# Patient Record
Sex: Female | Born: 1990 | Race: Black or African American | Hispanic: No | Marital: Married | Smoking: Never smoker
Health system: Southern US, Community
[De-identification: ages and names within clinical notes are randomized; demographics above are authoritative.]

## PROBLEM LIST (undated history)

## (undated) ENCOUNTER — Inpatient Hospital Stay (HOSPITAL_COMMUNITY): Payer: Self-pay

## (undated) DIAGNOSIS — G43909 Migraine, unspecified, not intractable, without status migrainosus: Secondary | ICD-10-CM

## (undated) DIAGNOSIS — I10 Essential (primary) hypertension: Secondary | ICD-10-CM

## (undated) DIAGNOSIS — O139 Gestational [pregnancy-induced] hypertension without significant proteinuria, unspecified trimester: Secondary | ICD-10-CM

## (undated) HISTORY — PX: NO PAST SURGERIES: SHX2092

---

## 2012-07-15 ENCOUNTER — Encounter: Payer: Self-pay | Admitting: Obstetrics & Gynecology

## 2012-07-24 ENCOUNTER — Encounter: Payer: Self-pay | Admitting: Obstetrics & Gynecology

## 2012-08-10 ENCOUNTER — Encounter: Payer: Self-pay | Admitting: Obstetrics & Gynecology

## 2013-01-22 ENCOUNTER — Emergency Department (HOSPITAL_COMMUNITY)
Admission: EM | Admit: 2013-01-22 | Discharge: 2013-01-23 | Disposition: A | Discharge status: Home / Self Care | Payer: Self-pay | Attending: Emergency Medicine | Admitting: Emergency Medicine

## 2013-01-22 ENCOUNTER — Encounter (HOSPITAL_COMMUNITY): Payer: Self-pay | Admitting: Emergency Medicine

## 2013-01-22 DIAGNOSIS — R51 Headache: Secondary | ICD-10-CM | POA: Insufficient documentation

## 2013-01-22 DIAGNOSIS — N938 Other specified abnormal uterine and vaginal bleeding: Secondary | ICD-10-CM | POA: Insufficient documentation

## 2013-01-22 DIAGNOSIS — N939 Abnormal uterine and vaginal bleeding, unspecified: Secondary | ICD-10-CM

## 2013-01-22 DIAGNOSIS — N925 Other specified irregular menstruation: Secondary | ICD-10-CM | POA: Insufficient documentation

## 2013-01-22 DIAGNOSIS — R109 Unspecified abdominal pain: Secondary | ICD-10-CM | POA: Insufficient documentation

## 2013-01-22 DIAGNOSIS — N949 Unspecified condition associated with female genital organs and menstrual cycle: Secondary | ICD-10-CM | POA: Insufficient documentation

## 2013-01-22 DIAGNOSIS — Z3202 Encounter for pregnancy test, result negative: Secondary | ICD-10-CM | POA: Insufficient documentation

## 2013-01-22 LAB — URINALYSIS, ROUTINE W REFLEX MICROSCOPIC
Bilirubin Urine: NEGATIVE
GLUCOSE, UA: NEGATIVE mg/dL
Ketones, ur: NEGATIVE mg/dL
LEUKOCYTES UA: NEGATIVE
Nitrite: NEGATIVE
PH: 6 (ref 5.0–8.0)
Protein, ur: >300 mg/dL — AB
SPECIFIC GRAVITY, URINE: 1.02 (ref 1.005–1.030)
Urobilinogen, UA: 1 mg/dL (ref 0.0–1.0)

## 2013-01-22 LAB — CBC WITH DIFFERENTIAL/PLATELET
Basophils Absolute: 0 10*3/uL (ref 0.0–0.1)
Basophils Relative: 0 % (ref 0–1)
EOS PCT: 2 % (ref 0–5)
Eosinophils Absolute: 0.1 10*3/uL (ref 0.0–0.7)
HEMATOCRIT: 36.1 % (ref 36.0–46.0)
HEMOGLOBIN: 12.8 g/dL (ref 12.0–15.0)
LYMPHS ABS: 3.1 10*3/uL (ref 0.7–4.0)
LYMPHS PCT: 47 % — AB (ref 12–46)
MCH: 30.3 pg (ref 26.0–34.0)
MCHC: 35.5 g/dL (ref 30.0–36.0)
MCV: 85.3 fL (ref 78.0–100.0)
MONO ABS: 0.5 10*3/uL (ref 0.1–1.0)
Monocytes Relative: 7 % (ref 3–12)
NEUTROS ABS: 2.9 10*3/uL (ref 1.7–7.7)
Neutrophils Relative %: 44 % (ref 43–77)
Platelets: 351 10*3/uL (ref 150–400)
RBC: 4.23 MIL/uL (ref 3.87–5.11)
RDW: 12.9 % (ref 11.5–15.5)
WBC: 6.6 10*3/uL (ref 4.0–10.5)

## 2013-01-22 LAB — COMPREHENSIVE METABOLIC PANEL
ALT: 19 U/L (ref 0–35)
AST: 15 U/L (ref 0–37)
Albumin: 2.9 g/dL — ABNORMAL LOW (ref 3.5–5.2)
Alkaline Phosphatase: 65 U/L (ref 39–117)
BILIRUBIN TOTAL: 0.2 mg/dL — AB (ref 0.3–1.2)
BUN: 18 mg/dL (ref 6–23)
CALCIUM: 8.6 mg/dL (ref 8.4–10.5)
CHLORIDE: 102 meq/L (ref 96–112)
CO2: 24 meq/L (ref 19–32)
CREATININE: 0.79 mg/dL (ref 0.50–1.10)
GLUCOSE: 123 mg/dL — AB (ref 70–99)
Potassium: 3.6 mEq/L — ABNORMAL LOW (ref 3.7–5.3)
Sodium: 139 mEq/L (ref 137–147)
Total Protein: 6.6 g/dL (ref 6.0–8.3)

## 2013-01-22 LAB — URINE MICROSCOPIC-ADD ON

## 2013-01-22 LAB — POCT PREGNANCY, URINE: PREG TEST UR: NEGATIVE

## 2013-01-22 NOTE — ED Notes (Signed)
Pt. reports vaginal bleeding for several days with low abdominal cramping .

## 2013-01-22 NOTE — ED Notes (Signed)
Updated on wait time.  

## 2013-01-23 LAB — WET PREP, GENITAL
TRICH WET PREP: NONE SEEN
YEAST WET PREP: NONE SEEN

## 2013-01-23 LAB — RPR: RPR Ser Ql: NONREACTIVE

## 2013-01-23 LAB — HIV ANTIBODY (ROUTINE TESTING W REFLEX): HIV: NONREACTIVE

## 2013-01-23 MED ORDER — KETOROLAC TROMETHAMINE 30 MG/ML IJ SOLN: 30.0000 mg | Freq: Once | INTRAMUSCULAR | Status: DC

## 2013-01-23 MED ORDER — MEDROXYPROGESTERONE ACETATE 10 MG PO TABS
10.0000 mg | ORAL_TABLET | ORAL | Status: AC
  Administered 2013-01-23: 10 mg via ORAL
  Filled 2013-01-23: qty 1

## 2013-01-23 MED ORDER — MEDROXYPROGESTERONE ACETATE 10 MG PO TABS: 10.0000 mg | ORAL_TABLET | Freq: Every day | ORAL | Status: DC

## 2013-01-23 NOTE — ED Provider Notes (Signed)
CSN: [id number]     Arrival date & time 01/22/13  2012 History   First MD Initiated Contact with Patient 01/23/13 0000     Chief Complaint  Patient presents with  . Vaginal Bleeding   (Consider location/radiation/quality/duration/timing/severity/associated sxs/prior Treatment) Patient is a 23 y.o. female presenting with vaginal bleeding. The history is provided by the patient.  Vaginal Bleeding She is 3 months postpartum and started her menses 11 days ago. She continues to have significant bleeding using approximately 5-6 pads a day with some mild suprapubic cramping. She's also had a mild headache. She denies fever or chills. There's been no nausea vomiting. She's not using any contraception but she is breast-feeding. She had a normal menses in approximately one month before this episode.  History reviewed. No pertinent past medical history. History reviewed. No pertinent past surgical history. No family history on file. History  Substance Use Topics  . Smoking status: Never Smoker   . Smokeless tobacco: Not on file  . Alcohol Use: No   OB History   Grav Para Term Preterm Abortions TAB SAB Ect Mult Living                 Review of Systems  Genitourinary: Positive for vaginal bleeding.  All other systems reviewed and are negative.    Allergies  Review of patient's allergies indicates no known allergies.  Home Medications  No current outpatient prescriptions on file. BP 111/73  Pulse 70  Temp(Src) 98.1 F (36.7 C) (Oral)  Resp 16  SpO2 97%  LMP 01/13/2013 Physical Exam  Nursing note and vitals reviewed.  23 year old female, resting comfortably and in no acute distress. Vital signs are normal. Oxygen saturation is 100%, which is normal. Head is normocephalic and atraumatic. PERRLA, EOMI. Oropharynx is clear. Neck is nontender and supple without adenopathy or JVD. Back is nontender and there is no CVA tenderness. Lungs are clear without rales, wheezes, or  rhonchi. Chest is nontender. Heart has regular rate and rhythm without murmur. Abdomen is soft, flat, nontender without masses or hepatosplenomegaly and peristalsis is normoactive. Pelvic: Normal external female genitalia, cervix is closed with a small amount of blood present in the vaginal vault, no obvious discharge is present, there is no cervical motion tenderness, there is no adnexal tenderness or masses, fundus is retroverted and nontender. Extremities have no cyanosis or edema, full range of motion is present. Skin is warm and dry without rash. Neurologic: Mental status is normal, cranial nerves are intact, there are no motor or sensory deficits.  ED Course  Procedures (including critical care time) Labs Review Results for orders placed during the hospital encounter of 01/22/13  WET PREP, GENITAL      Result Value Range   Yeast Wet Prep HPF POC NONE SEEN  NONE SEEN   Trich, Wet Prep NONE SEEN  NONE SEEN   Clue Cells Wet Prep HPF POC MODERATE (*) NONE SEEN   WBC, Wet Prep HPF POC FEW (*) NONE SEEN  URINALYSIS, ROUTINE W REFLEX MICROSCOPIC      Result Value Range   Color, Urine YELLOW  YELLOW   APPearance CLEAR  CLEAR   Specific Gravity, Urine 1.020  1.005 - 1.030   pH 6.0  5.0 - 8.0   Glucose, UA NEGATIVE  NEGATIVE mg/dL   Hgb urine dipstick LARGE (*) NEGATIVE   Bilirubin Urine NEGATIVE  NEGATIVE   Ketones, ur NEGATIVE  NEGATIVE mg/dL   Protein, ur >784>300 (*) NEGATIVE mg/dL  Urobilinogen, UA 1.0  0.0 - 1.0 mg/dL   Nitrite NEGATIVE  NEGATIVE   Leukocytes, UA NEGATIVE  NEGATIVE  CBC WITH DIFFERENTIAL      Result Value Range   WBC 6.6  4.0 - 10.5 K/uL   RBC 4.23  3.87 - 5.11 MIL/uL   Hemoglobin 12.8  12.0 - 15.0 g/dL   HCT 45.4  09.8 - 11.9 %   MCV 85.3  78.0 - 100.0 fL   MCH 30.3  26.0 - 34.0 pg   MCHC 35.5  30.0 - 36.0 g/dL   RDW 14.7  82.9 - 56.2 %   Platelets 351  150 - 400 K/uL   Neutrophils Relative % 44  43 - 77 %   Neutro Abs 2.9  1.7 - 7.7 K/uL   Lymphocytes  Relative 47 (*) 12 - 46 %   Lymphs Abs 3.1  0.7 - 4.0 K/uL   Monocytes Relative 7  3 - 12 %   Monocytes Absolute 0.5  0.1 - 1.0 K/uL   Eosinophils Relative 2  0 - 5 %   Eosinophils Absolute 0.1  0.0 - 0.7 K/uL   Basophils Relative 0  0 - 1 %   Basophils Absolute 0.0  0.0 - 0.1 K/uL  COMPREHENSIVE METABOLIC PANEL      Result Value Range   Sodium 139  137 - 147 mEq/L   Potassium 3.6 (*) 3.7 - 5.3 mEq/L   Chloride 102  96 - 112 mEq/L   CO2 24  19 - 32 mEq/L   Glucose, Bld 123 (*) 70 - 99 mg/dL   BUN 18  6 - 23 mg/dL   Creatinine, Ser 1.30  0.50 - 1.10 mg/dL   Calcium 8.6  8.4 - 86.5 mg/dL   Total Protein 6.6  6.0 - 8.3 g/dL   Albumin 2.9 (*) 3.5 - 5.2 g/dL   AST 15  0 - 37 U/L   ALT 19  0 - 35 U/L   Alkaline Phosphatase 65  39 - 117 U/L   Total Bilirubin 0.2 (*) 0.3 - 1.2 mg/dL   GFR calc non Af Amer >90  >90 mL/min   GFR calc Af Amer >90  >90 mL/min  URINE MICROSCOPIC-ADD ON      Result Value Range   Squamous Epithelial / LPF RARE  RARE   RBC / HPF 11-20  <3 RBC/hpf   Bacteria, UA RARE  RARE  POCT PREGNANCY, URINE      Result Value Range   Preg Test, Ur NEGATIVE  NEGATIVE    MDM   1. Abnormal vaginal bleeding    Abnormal vaginal bleeding.   She is sent home with a prescription for medroxyprogesterone and is to followup with GYN.   Dione Booze, MD 01/23/13 [phone]

## 2013-01-23 NOTE — ED Notes (Signed)
EDP in room with pt 

## 2013-01-23 NOTE — Discharge Instructions (Signed)
Abnormal Uterine Bleeding °Abnormal uterine bleeding can affect women at various stages in life, including teenagers, women in their reproductive years, pregnant women, and women who have reached menopause. Several kinds of uterine bleeding are considered abnormal, including: °· Bleeding or spotting between periods.   °· Bleeding after sexual intercourse.   °· Bleeding that is heavier or more than normal.   °· Periods that last longer than usual. °· Bleeding after menopause.   °Many cases of abnormal uterine bleeding are minor and simple to treat, while others are more serious. Any type of abnormal bleeding should be evaluated by your health care provider. Treatment will depend on the cause of the bleeding. °HOME CARE INSTRUCTIONS °Monitor your condition for any changes. The following actions may help to alleviate any discomfort you are experiencing: °· Avoid the use of tampons and douches as directed by your health care provider. °· Change your pads frequently. °You should get regular pelvic exams and Pap tests. Keep all follow-up appointments for diagnostic tests as directed by your health care provider.  °SEEK MEDICAL CARE IF:  °· Your bleeding lasts more than 1 week.   °· You feel dizzy at times.   °SEEK IMMEDIATE MEDICAL CARE IF:  °· You pass out.   °· You are changing pads every 15 to 30 minutes.   °· You have abdominal pain. °· You have a fever.   °· You become sweaty or weak.   °· You are passing large blood clots from the vagina.   °· You start to feel nauseous and vomit. °MAKE SURE YOU:  °· Understand these instructions. °· Will watch your condition. °· Will get help right away if you are not doing well or get worse. °Document Released: 12/31/2004 Document Revised: 09/02/2012 Document Reviewed: 07/30/2012 °ExitCare® Patient Information ©2014 [employer]. ° °Medroxyprogesterone tablets °What is this medicine? °MEDROXYPROGESTERONE (me DROX ee proe JES te rone) is a hormone in a class called progestins. It  is commonly used to prevent the uterine lining from overgrowth in women taking an estrogen after menopause. It is also used to treat irregular menstrual bleeding or a lack of menstrual bleeding in women. °This medicine may be used for other purposes; ask your health care provider or pharmacist if you have questions. °COMMON BRAND NAME(S): Amen, Provera °What should I tell my health care provider before I take this medicine? °They need to know if you have any of these conditions: °-blood vessel disease or a history of a blood clot in the lungs or legs °-breast, cervical or vaginal cancer °-heart disease °-kidney disease °-liver disease °-migraine °-recent miscarriage or abortion °-mental depression °-migraine °-seizures (convulsions) °-stroke °-vaginal bleeding that has not been evaluated °-an unusual or allergic reaction to medroxyprogesterone, other medicines, foods, dyes, or preservatives °-pregnant or trying to get pregnant °-breast-feeding °How should I use this medicine? °Take this medicine by mouth with a glass of water. Follow the directions on the prescription label. Take your doses at regular intervals. Do not take your medicine more often than directed. °Talk to your pediatrician regarding the use of this medicine in children. Special care may be needed. While this drug may be prescribed for children as young as 13 years for selected conditions, precautions do apply. °Overdosage: If you think you have taken too much of this medicine contact a poison control center or emergency room at once. °NOTE: This medicine is only for you. Do not share this medicine with others. °What if I miss a dose? °If you miss a dose, take it as soon as you can. If   it is almost time for your next dose, take only that dose. Do not take double or extra doses. °What may interact with this medicine? °-barbiturate medicines for inducing sleep or treating seizures (convulsions) °-bosentan °-carbamazepine °-phenytoin °-rifampin °-St.  John's Wort °This list may not describe all possible interactions. Give your health care provider a list of all the medicines, herbs, non-prescription drugs, or dietary supplements you use. Also tell them if you smoke, drink alcohol, or use illegal drugs. Some items may interact with your medicine. °What should I watch for while using this medicine? °Visit your health care professional for regular checks on your progress. You will need a regular breast and pelvic exam. °If you have any reason to think you are pregnant, stop taking this medicine at once and contact your doctor or health care professional. °What side effects may I notice from receiving this medicine? °Side effects that you should report to your doctor or health care professional as soon as possible: °-breast tenderness or discharge °-changes in mood or emotions, such as depression °-changes in vision or speech °-pain in the abdomen, chest, groin, or leg °-severe headache °-skin rash, itching, or hives °-sudden shortness of breath °-unusually weak or tired °-yellowing of skin or eyes °Side effects that usually do not require medical attention (report to your doctor or health care professional if they continue or are bothersome): °-acne °-change in menstrual bleeding pattern or flow °-changes in sexual desire °-facial hair growth °-fluid retention and swelling °-headache °-upset stomach °-weight gain or loss °This list may not describe all possible side effects. Call your doctor for medical advice about side effects. You may report side effects to FDA at 1-800-FDA-1088. °Where should I keep my medicine? °Keep out of the reach of children. °Store at room temperature between 20 and 25 degrees C (68 and 77 degrees F). Throw away any unused medicine after the expiration date. °NOTE: This sheet is a summary. It may not cover all possible information. If you have questions about this medicine, talk to your doctor, pharmacist, or health care provider. °© 2014,  Elsevier/Gold Standard. (2007-12-31 11:26:12) ° °

## 2013-01-23 NOTE — ED Notes (Signed)
Pt friend at bedside is translating.  Pt speaks only french.  Pt states she is having a period that started on the 31st of this month and has not stopped.  Pt states the color of the blood changed 6 days ago from a dark color, to a bright red.

## 2013-01-25 LAB — GC/CHLAMYDIA PROBE AMP
CT Probe RNA: NEGATIVE
GC Probe RNA: NEGATIVE

## 2014-03-08 ENCOUNTER — Emergency Department (HOSPITAL_COMMUNITY): Payer: 59

## 2014-03-08 ENCOUNTER — Emergency Department (HOSPITAL_COMMUNITY)
Admission: EM | Admit: 2014-03-08 | Discharge: 2014-03-08 | Disposition: A | Discharge status: Home / Self Care | Payer: 59 | Attending: Emergency Medicine | Admitting: Emergency Medicine

## 2014-03-08 ENCOUNTER — Encounter (HOSPITAL_COMMUNITY): Payer: Self-pay

## 2014-03-08 DIAGNOSIS — Z79899 Other long term (current) drug therapy: Secondary | ICD-10-CM | POA: Diagnosis not present

## 2014-03-08 DIAGNOSIS — R1011 Right upper quadrant pain: Secondary | ICD-10-CM | POA: Insufficient documentation

## 2014-03-08 DIAGNOSIS — R1013 Epigastric pain: Secondary | ICD-10-CM | POA: Insufficient documentation

## 2014-03-08 DIAGNOSIS — R1012 Left upper quadrant pain: Secondary | ICD-10-CM | POA: Diagnosis not present

## 2014-03-08 DIAGNOSIS — R1010 Upper abdominal pain, unspecified: Secondary | ICD-10-CM

## 2014-03-08 LAB — CBC WITH DIFFERENTIAL/PLATELET
BASOS PCT: 0 % (ref 0–1)
Basophils Absolute: 0 10*3/uL (ref 0.0–0.1)
Eosinophils Absolute: 0.1 10*3/uL (ref 0.0–0.7)
Eosinophils Relative: 2 % (ref 0–5)
HCT: 40.3 % (ref 36.0–46.0)
Hemoglobin: 13.5 g/dL (ref 12.0–15.0)
LYMPHS ABS: 2.1 10*3/uL (ref 0.7–4.0)
Lymphocytes Relative: 42 % (ref 12–46)
MCH: 29.5 pg (ref 26.0–34.0)
MCHC: 33.5 g/dL (ref 30.0–36.0)
MCV: 88 fL (ref 78.0–100.0)
MONO ABS: 0.4 10*3/uL (ref 0.1–1.0)
Monocytes Relative: 8 % (ref 3–12)
NEUTROS ABS: 2.4 10*3/uL (ref 1.7–7.7)
Neutrophils Relative %: 48 % (ref 43–77)
Platelets: 362 10*3/uL (ref 150–400)
RBC: 4.58 MIL/uL (ref 3.87–5.11)
RDW: 12.5 % (ref 11.5–15.5)
WBC: 5 10*3/uL (ref 4.0–10.5)

## 2014-03-08 LAB — COMPREHENSIVE METABOLIC PANEL
ALBUMIN: 2.9 g/dL — AB (ref 3.5–5.2)
ALT: 18 U/L (ref 0–35)
AST: 18 U/L (ref 0–37)
Alkaline Phosphatase: 59 U/L (ref 39–117)
Anion gap: 9 (ref 5–15)
BILIRUBIN TOTAL: 0.4 mg/dL (ref 0.3–1.2)
BUN: 9 mg/dL (ref 6–23)
CO2: 25 mmol/L (ref 19–32)
Calcium: 9.1 mg/dL (ref 8.4–10.5)
Chloride: 105 mmol/L (ref 96–112)
Creatinine, Ser: 0.52 mg/dL (ref 0.50–1.10)
GFR calc Af Amer: >90 mL/min (ref >90)
GFR calc non Af Amer: >90 mL/min (ref >90)
Glucose, Bld: 125 mg/dL — ABNORMAL HIGH (ref 70–99)
Potassium: 3.6 mmol/L (ref 3.5–5.1)
Sodium: 139 mmol/L (ref 135–145)
Total Protein: 7 g/dL (ref 6.0–8.3)

## 2014-03-08 LAB — LIPASE, BLOOD: Lipase: 24 U/L (ref 11–59)

## 2014-03-08 MED ORDER — ONDANSETRON 4 MG PO TBDP: 4.0000 mg | ORAL_TABLET | Freq: Three times a day (TID) | ORAL | Status: DC | PRN

## 2014-03-08 MED ORDER — GI COCKTAIL ~~LOC~~
30.0000 mL | Freq: Once | ORAL | Status: AC
  Administered 2014-03-08: 30 mL via ORAL
  Filled 2014-03-08: qty 30

## 2014-03-08 MED ORDER — ONDANSETRON 4 MG PO TBDP
4.0000 mg | ORAL_TABLET | Freq: Once | ORAL | Status: AC
  Administered 2014-03-08: 4 mg via ORAL
  Filled 2014-03-08: qty 1

## 2014-03-08 MED ORDER — OMEPRAZOLE 20 MG PO CPDR: DELAYED_RELEASE_CAPSULE | ORAL | Status: DC

## 2014-03-08 MED ORDER — SUCRALFATE 1 G PO TABS: 1.0000 g | ORAL_TABLET | Freq: Three times a day (TID) | ORAL | Status: DC

## 2014-03-08 NOTE — ED Notes (Signed)
Patient and spouse prefer for spouse to interpret.

## 2014-03-08 NOTE — ED Notes (Signed)
Pt c/o generalized abdominal pain and acid reflux x 1 week and began having excessive belching yesterday.  Pt was seen at urgent care x 5 days ago and was told that all lab work came back WDL.  Denies n/v/d.    Pt does not speak [place].  Husband interpreted.  Pt speaks Wol[place] and [place].

## 2014-03-08 NOTE — Discharge Instructions (Signed)
Please read and follow all provided instructions.  Your diagnoses today include:  1. Upper abdominal pain     Tests performed today include:  Blood counts and electrolytes  Blood tests to check liver and kidney function  Blood tests to check pancreas function  Ultrasound - does not show any gallstones, shows a liver cyst that should be rechecked in 6 months by your doctor to make sure it is not changing  Vital signs. See below for your results today.   Medications prescribed:   Omeprazole (Prilosec) - stomach acid reducer  This medication can be found over-the-counter   Carafate - for stomach upset and to protect your stomach   Zofran (ondansetron) - for nausea and vomiting  Take any prescribed medications only as directed.  Home care instructions:   Follow any educational materials contained in this packet.  Follow-up instructions: Please follow-up with your primary care provider in the next 3 days for further evaluation of your symptoms. Also call the stomach doctor for an appointment.  Return instructions:  SEEK IMMEDIATE MEDICAL ATTENTION IF:  The pain does not go away or becomes severe   A temperature above 101F develops   Repeated vomiting occurs (multiple episodes)   The pain becomes localized to portions of the abdomen. The right side could possibly be appendicitis. In an adult, the left lower portion of the abdomen could be colitis or diverticulitis.   Blood is being passed in stools or vomit (bright red or black tarry stools)   You develop chest pain, difficulty breathing, dizziness or fainting, or become confused, poorly responsive, or inconsolable (young children)  If you have any other emergent concerns regarding your health  Additional Information: Abdominal (belly) pain can be caused by many things. Your caregiver performed an examination and possibly ordered blood/urine tests and imaging (CT scan, x-rays, ultrasound). Many cases can be observed  and treated at home after initial evaluation in the emergency department. Even though you are being discharged home, abdominal pain can be unpredictable. Therefore, you need a repeated exam if your pain does not resolve, returns, or worsens. Most patients with abdominal pain don't have to be admitted to the hospital or have surgery, but serious problems like appendicitis and gallbladder attacks can start out as nonspecific pain. Many abdominal conditions cannot be diagnosed in one visit, so follow-up evaluations are very important.  Your vital signs today were: BP 127/67 mmHg   Pulse 74   Temp(Src) 98.5 F (36.9 C) (Oral)   Resp 18   SpO2 100%   LMP 02/15/2014 (Approximate) If your blood pressure (bp) was elevated above 135/85 this visit, please have this repeated by your doctor within one month. --------------

## 2014-03-08 NOTE — ED Notes (Signed)
Pt complaining of nausea.   

## 2014-03-08 NOTE — ED Notes (Signed)
Pt states she is having epigastric pain and lower abd pain. Epigastric pain is described as burning and gets worse with eating spicy food. Lower abd pain is described as sharp pain. Denies any fever, constipation, nausea, vomiting or diarrhea.

## 2014-03-08 NOTE — ED Provider Notes (Signed)
CSN: [id number]     Arrival date & time 03/08/14  1814 History   First MD Initiated Contact with Patient 03/08/14 1919     Chief Complaint  Patient presents with  . Abdominal Pain  . Gastrophageal Reflux     (Consider location/radiation/quality/duration/timing/severity/associated sxs/prior Treatment) HPI Comments: Patient with no history of abdominal surgeries -- presents with complaint of epigastric pain and throat pain described as burning in the throat and sharp in the epigastrium. Burning pain is worse with eating acidic and spicy foods. Agent does have occasional pain in her back and on her shoulders. She has taken over-the-counter antacids with some relief. She has had frequent belching with the symptoms. Patient was seen at a [facility] facility on 2/19 and had reassuring labs, urine which showed blood and protein in urine. For this she has been referred to a urologist. Patient denies fever, chest pain, shortness of breath, cough, nausea, vomiting, diarrhea. No dysuria. The onset of this condition was acute. The course is waxing and waning.     Patient is a 24 y.o. female presenting with abdominal pain and GERD. The history is provided by the patient. The history is limited by a language barrier. A language interpreter was used (husband speaks fluent english).  Abdominal Pain Associated symptoms: no chest pain, no cough, no diarrhea, no dysuria, no fever, no nausea, no shortness of breath, no sore throat and no vomiting   Gastrophageal Reflux Associated symptoms include abdominal pain. Pertinent negatives include no chest pain, coughing, fever, headaches, myalgias, nausea, rash, sore throat or vomiting.    History reviewed. No pertinent past medical history. History reviewed. No pertinent past surgical history. History reviewed. No pertinent family history. History  Substance Use Topics  . Smoking status: Never Smoker   . Smokeless tobacco: Not on file  . Alcohol Use: No   OB History     No data available     Review of Systems  Constitutional: Negative for fever.  HENT: Negative for rhinorrhea and sore throat.   Eyes: Negative for redness.  Respiratory: Negative for cough and shortness of breath.   Cardiovascular: Negative for chest pain.  Gastrointestinal: Positive for abdominal pain. Negative for nausea, vomiting and diarrhea.  Genitourinary: Negative for dysuria.  Musculoskeletal: Negative for myalgias.  Skin: Negative for rash.  Neurological: Negative for headaches.      Allergies  Review of patient's allergies indicates no known allergies.  Home Medications   Prior to Admission medications   Medication Sig Start Date End Date Taking? Authorizing Provider  medroxyPROGESTERone (PROVERA) 10 MG tablet Take 1 tablet (10 mg total) by mouth daily. 01/23/13   Dione Boozeavid Glick, MD   BP 116/75 mmHg  Pulse 88  Temp(Src) 98.5 F (36.9 C) (Oral)  Resp 16  SpO2 100%  LMP 02/15/2014 (Approximate) Physical Exam  Constitutional: She appears well-developed and well-nourished.  HENT:  Head: Normocephalic and atraumatic.  Eyes: Conjunctivae are normal. Right eye exhibits no discharge. Left eye exhibits no discharge.  Neck: Normal range of motion. Neck supple.  Cardiovascular: Normal rate, regular rhythm and normal heart sounds.   Pulmonary/Chest: Effort normal and breath sounds normal.  Abdominal: Soft. There is tenderness (Minimal RUQ, epigastric, and LUQ tenderness). There is no rebound and no guarding.  Neurological: She is alert.  Skin: Skin is warm and dry.  Psychiatric: She has a normal mood and affect.  Nursing note and vitals reviewed.   ED Course  Procedures (including critical care time) Labs Review Labs Reviewed  COMPREHENSIVE METABOLIC PANEL - Abnormal; Notable for the following:    Glucose, Bld 125 (*)    Albumin 2.9 (*)    All other components within normal limits  CBC WITH DIFFERENTIAL/PLATELET  LIPASE, BLOOD    Imaging Review US Abdomen  Complete  03/08/2014   CLINICAL DATA:  Acute epigastric pain with reflux for 1 week.  EXAM: ULTRASOUND ABDOMEN COMPLETE  COMPARISON:  None.  FINDINGS: Gallbladder: No gallstones or wall thickening visualized. No sonographic Murphy sign noted.  Common bile duct: Diameter: 3.7 mm.  Liver: Normal echogenicity. Focal increased echogenic area within the central right liver anterior to the portal vein measures 1.7 cm in diameter, suspect small hemangioma. Patent portal vein with normal hepatopetal flow. No biliary dilatation. No other significant hepatic abnormality.  IVC: No abnormality visualized.  Pancreas: Visualized portion unremarkable.  Spleen: Size and appearance within normal limits.  Right Kidney: Length: 10.9 cm. Echogenicity within normal limits. No mass or hydronephrosis visualized.  Left Kidney: Length: 11.8 cm. Echogenicity within normal limits. No mass or hydronephrosis visualized.  Abdominal aorta: No aneurysm visualized.  Other findings: None.  IMPRESSION: Central right hepatic 1.7 cm echogenic lesion, suspect hemangioma , recommend follow-up ultrasound in 6 months to document stability.  No other acute finding. Negative for gallstones or biliary dilatation.   Electronically Signed   By: Judie Petit.  Shick M.D.   On: 03/08/2014 22:22     EKG Interpretation None       8:11 PM Patient seen and examined. Work-up initiated. Medications ordered. DDx includes GERD, PUD, choelithiasis. Will r/o gallbladder etiology with ultrasound. Suspect will need PPI, Carafate, GI referral.  Vital signs reviewed and are as follows: BP 116/75 mmHg  Pulse 88  Temp(Src) 98.5 F (36.9 C) (Oral)  Resp 16  SpO2 100%  LMP 02/15/2014 (Approximate)  11:11 PM [place] neg. Pt informed.   Plan: start omeprazole, carafate, zofran prn, GI/PCP referral.   The patient was urged to return to the Emergency Department immediately with worsening of current symptoms, worsening abdominal pain, persistent vomiting, blood noted in stools,  fever, or any other concerns. The patient verbalized understanding.    MDM   Final diagnoses:  Upper abdominal pain   Patient with epigastric and burning pain in her chest consistent with GERD/PUD. Ultrasound performed today does not show any gallstones. Labs are reassuring. Lab work from recent [facility] visit reviewed. She is having follow-up arranged regarding the protein and blood in her urine. Patient appears well. Will start on treatment for GERD/PUD and have patient follow-up with PCP/GI. Return instructions discussed with patient at bedside.  Renne Crigler, PA-C 03/08/14 2313  Enid Skeens, MD 03/09/14 [phone]

## 2015-01-15 NOTE — L&D Delivery Note (Signed)
Delivery Note At 11:50 AM a viable female was delivered via Vaginal, Spontaneous Delivery (Presentation: OA).  APGAR: 9, 9; weight pending.   Placenta status: S/I/3VC with the following complications: none.  Cord pH: n/a  Anesthesia: epidural  Episiotomy: None Lacerations: 2nd degree Suture Repair: 3.0 vicryl rapide Est. Blood Loss (mL): 250   Mom to postpartum.  Baby to Couplet care / Skin to Skin.  Ji Fairburn 12/14/2015, 12:24 PM

## 2015-05-17 LAB — OB RESULTS CONSOLE ANTIBODY SCREEN: ANTIBODY SCREEN: NEGATIVE

## 2015-05-17 LAB — OB RESULTS CONSOLE HIV ANTIBODY (ROUTINE TESTING): HIV: NONREACTIVE

## 2015-05-17 LAB — OB RESULTS CONSOLE HEPATITIS B SURFACE ANTIGEN: HEP B S AG: NEGATIVE

## 2015-05-17 LAB — OB RESULTS CONSOLE RUBELLA ANTIBODY, IGM: RUBELLA: IMMUNE

## 2015-05-17 LAB — OB RESULTS CONSOLE RPR: RPR: NONREACTIVE

## 2015-05-17 LAB — OB RESULTS CONSOLE ABO/RH: RH Type: POSITIVE

## 2015-05-29 LAB — OB RESULTS CONSOLE GC/CHLAMYDIA
CHLAMYDIA, DNA PROBE: NEGATIVE
Gonorrhea: NEGATIVE

## 2015-07-06 ENCOUNTER — Emergency Department (HOSPITAL_COMMUNITY)
Admission: EM | Admit: 2015-07-06 | Discharge: 2015-07-06 | Disposition: A | Discharge status: Home / Self Care | Payer: BLUE CROSS/BLUE SHIELD | Attending: Emergency Medicine | Admitting: Emergency Medicine

## 2015-07-06 ENCOUNTER — Encounter (HOSPITAL_COMMUNITY): Payer: Self-pay | Admitting: Emergency Medicine

## 2015-07-06 ENCOUNTER — Emergency Department (HOSPITAL_COMMUNITY): Payer: BLUE CROSS/BLUE SHIELD

## 2015-07-06 DIAGNOSIS — Y999 Unspecified external cause status: Secondary | ICD-10-CM | POA: Diagnosis not present

## 2015-07-06 DIAGNOSIS — M542 Cervicalgia: Secondary | ICD-10-CM | POA: Diagnosis not present

## 2015-07-06 DIAGNOSIS — Y939 Activity, unspecified: Secondary | ICD-10-CM | POA: Diagnosis not present

## 2015-07-06 DIAGNOSIS — O9A212 Injury, poisoning and certain other consequences of external causes complicating pregnancy, second trimester: Secondary | ICD-10-CM | POA: Diagnosis present

## 2015-07-06 DIAGNOSIS — M25572 Pain in left ankle and joints of left foot: Secondary | ICD-10-CM | POA: Diagnosis not present

## 2015-07-06 DIAGNOSIS — Y92410 Unspecified street and highway as the place of occurrence of the external cause: Secondary | ICD-10-CM | POA: Insufficient documentation

## 2015-07-06 DIAGNOSIS — M25522 Pain in left elbow: Secondary | ICD-10-CM | POA: Diagnosis not present

## 2015-07-06 DIAGNOSIS — Z3A17 17 weeks gestation of pregnancy: Secondary | ICD-10-CM | POA: Diagnosis not present

## 2015-07-06 DIAGNOSIS — M546 Pain in thoracic spine: Secondary | ICD-10-CM | POA: Insufficient documentation

## 2015-07-06 DIAGNOSIS — M545 Low back pain: Secondary | ICD-10-CM | POA: Insufficient documentation

## 2015-07-06 MED ORDER — ACETAMINOPHEN 325 MG PO TABS
650.0000 mg | ORAL_TABLET | Freq: Once | ORAL | Status: AC
  Administered 2015-07-06: 650 mg via ORAL
  Filled 2015-07-06: qty 2

## 2015-07-06 NOTE — ED Notes (Signed)
MD at bedside. 

## 2015-07-06 NOTE — ED Notes (Signed)
Pt ambulated very well, but began to complain of feeling glass in her right foot. Informed the Resident Physician - Melene MullerBatista.

## 2015-07-06 NOTE — ED Notes (Signed)
Pt reports pain in neck, shoulder and legs.

## 2015-07-06 NOTE — ED Notes (Signed)
Prior to ambulating the pt, this Tech tried to place a  sock on the pt's right foot, but pt began to complain of glass in her foot. Informed the Resident Physician - Melene MullerBatista and Dr. Jeraldine LootsLockwood.
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
## 2015-07-06 NOTE — ED Notes (Signed)
Fetal HR 154- Doppler

## 2015-07-06 NOTE — ED Notes (Signed)
Pt refused DG Lumbar Spine

## 2015-07-06 NOTE — ED Notes (Signed)
Patient transported to X-ray 
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
## 2015-07-06 NOTE — ED Notes (Signed)
Pt very tearful during vitals.

## 2015-07-06 NOTE — ED Notes (Signed)
Pt feet soaked in basin before discharge to ensure glass is completely out.

## 2015-11-23 LAB — OB RESULTS CONSOLE GBS: GBS: NEGATIVE

## 2015-11-25 ENCOUNTER — Encounter (HOSPITAL_COMMUNITY): Payer: Self-pay | Admitting: *Deleted

## 2015-11-25 ENCOUNTER — Inpatient Hospital Stay (HOSPITAL_COMMUNITY)
Admission: AD | Admit: 2015-11-25 | Discharge: 2015-11-25 | Disposition: A | Discharge status: Home / Self Care | Payer: BLUE CROSS/BLUE SHIELD | Source: Ambulatory Visit | Attending: Obstetrics and Gynecology | Admitting: Obstetrics and Gynecology

## 2015-11-25 DIAGNOSIS — Z3A36 36 weeks gestation of pregnancy: Secondary | ICD-10-CM | POA: Diagnosis not present

## 2015-11-25 DIAGNOSIS — Z3493 Encounter for supervision of normal pregnancy, unspecified, third trimester: Secondary | ICD-10-CM | POA: Diagnosis not present

## 2015-11-25 HISTORY — DX: Gestational (pregnancy-induced) hypertension without significant proteinuria, unspecified trimester: O13.9

## 2015-11-25 HISTORY — DX: Essential (primary) hypertension: I10

## 2015-11-25 LAB — URINE MICROSCOPIC-ADD ON

## 2015-11-25 LAB — URINALYSIS, ROUTINE W REFLEX MICROSCOPIC
BILIRUBIN URINE: NEGATIVE
Glucose, UA: NEGATIVE mg/dL
KETONES UR: NEGATIVE mg/dL
Leukocytes, UA: NEGATIVE
Nitrite: NEGATIVE
Protein, ur: 100 mg/dL — AB
SPECIFIC GRAVITY, URINE: 1.015 (ref 1.005–1.030)
pH: 5.5 (ref 5.0–8.0)

## 2015-11-25 NOTE — Progress Notes (Signed)
Telephone call to dr Renaldo Fiddleradkins, discussed pt chief complaint, fetal heart tracing and vaginal exam, pt may d/c home with labor precautions

## 2015-11-25 NOTE — MAU Note (Signed)
Contractions since yestarday. Denies leaking fluid, vaginal bleeding

## 2015-12-14 ENCOUNTER — Encounter (HOSPITAL_COMMUNITY): Payer: Self-pay | Admitting: Anesthesiology

## 2015-12-14 ENCOUNTER — Encounter (HOSPITAL_COMMUNITY): Payer: Self-pay

## 2015-12-14 ENCOUNTER — Inpatient Hospital Stay (HOSPITAL_COMMUNITY)
Admission: AD | Admit: 2015-12-14 | Discharge: 2015-12-16 | DRG: 775 | Disposition: A | Discharge status: Home / Self Care | Payer: BLUE CROSS/BLUE SHIELD | Source: Ambulatory Visit | Attending: Obstetrics & Gynecology | Admitting: Obstetrics & Gynecology

## 2015-12-14 DIAGNOSIS — Z3493 Encounter for supervision of normal pregnancy, unspecified, third trimester: Secondary | ICD-10-CM | POA: Diagnosis present

## 2015-12-14 DIAGNOSIS — Z3490 Encounter for supervision of normal pregnancy, unspecified, unspecified trimester: Secondary | ICD-10-CM

## 2015-12-14 DIAGNOSIS — Z3A39 39 weeks gestation of pregnancy: Secondary | ICD-10-CM | POA: Diagnosis not present

## 2015-12-14 LAB — CBC
HEMATOCRIT: 34.8 % — AB (ref 36.0–46.0)
HEMOGLOBIN: 12.1 g/dL (ref 12.0–15.0)
MCH: 29 pg (ref 26.0–34.0)
MCHC: 34.8 g/dL (ref 30.0–36.0)
MCV: 83.5 fL (ref 78.0–100.0)
Platelets: 256 10*3/uL (ref 150–400)
RBC: 4.17 MIL/uL (ref 3.87–5.11)
RDW: 13.8 % (ref 11.5–15.5)
WBC: 10.3 10*3/uL (ref 4.0–10.5)

## 2015-12-14 LAB — TYPE AND SCREEN
ABO/RH(D): A POS
ANTIBODY SCREEN: NEGATIVE

## 2015-12-14 LAB — RPR: RPR Ser Ql: NONREACTIVE

## 2015-12-14 LAB — ABO/RH: ABO/RH(D): A POS

## 2015-12-14 MED ORDER — SENNOSIDES-DOCUSATE SODIUM 8.6-50 MG PO TABS
2.0000 | ORAL_TABLET | ORAL | Status: DC
  Administered 2015-12-14 – 2015-12-15 (×2): 2 via ORAL
  Filled 2015-12-14 (×2): qty 2

## 2015-12-14 MED ORDER — FENTANYL 2.5 MCG/ML BUPIVACAINE 1/10 % EPIDURAL INFUSION (WH - ANES)
14.0000 mL/h | INTRAMUSCULAR | Status: DC | PRN
  Filled 2015-12-14: qty 100

## 2015-12-14 MED ORDER — ZOLPIDEM TARTRATE 5 MG PO TABS: 5.0000 mg | ORAL_TABLET | Freq: Every evening | ORAL | Status: DC | PRN

## 2015-12-14 MED ORDER — TETANUS-DIPHTH-ACELL PERTUSSIS 5-2.5-18.5 LF-MCG/0.5 IM SUSP: 0.5000 mL | Freq: Once | INTRAMUSCULAR | Status: DC

## 2015-12-14 MED ORDER — DIBUCAINE 1 % RE OINT
1.0000 "application " | TOPICAL_OINTMENT | RECTAL | Status: DC | PRN
  Administered 2015-12-15: 1 via RECTAL
  Filled 2015-12-14: qty 28

## 2015-12-14 MED ORDER — SOD CITRATE-CITRIC ACID 500-334 MG/5ML PO SOLN: 30.0000 mL | ORAL | Status: DC | PRN

## 2015-12-14 MED ORDER — ACETAMINOPHEN 325 MG PO TABS: 650.0000 mg | ORAL_TABLET | ORAL | Status: DC | PRN

## 2015-12-14 MED ORDER — DIPHENHYDRAMINE HCL 25 MG PO CAPS: 25.0000 mg | ORAL_CAPSULE | Freq: Four times a day (QID) | ORAL | Status: DC | PRN

## 2015-12-14 MED ORDER — OXYCODONE-ACETAMINOPHEN 5-325 MG PO TABS: 2.0000 | ORAL_TABLET | ORAL | Status: DC | PRN

## 2015-12-14 MED ORDER — LACTATED RINGERS IV SOLN: 500.0000 mL | INTRAVENOUS | Status: DC | PRN

## 2015-12-14 MED ORDER — IBUPROFEN 600 MG PO TABS
600.0000 mg | ORAL_TABLET | Freq: Four times a day (QID) | ORAL | Status: DC
  Administered 2015-12-14 – 2015-12-16 (×8): 600 mg via ORAL
  Filled 2015-12-14 (×8): qty 1

## 2015-12-14 MED ORDER — SIMETHICONE 80 MG PO CHEW: 80.0000 mg | CHEWABLE_TABLET | ORAL | Status: DC | PRN

## 2015-12-14 MED ORDER — COCONUT OIL OIL: 1.0000 "application " | TOPICAL_OIL | Status: DC | PRN

## 2015-12-14 MED ORDER — PHENYLEPHRINE 40 MCG/ML (10ML) SYRINGE FOR IV PUSH (FOR BLOOD PRESSURE SUPPORT)
80.0000 ug | PREFILLED_SYRINGE | INTRAVENOUS | Status: DC | PRN
  Filled 2015-12-14: qty 5

## 2015-12-14 MED ORDER — LACTATED RINGERS IV SOLN
500.0000 mL | Freq: Once | INTRAVENOUS | Status: AC
  Administered 2015-12-14: 500 mL via INTRAVENOUS

## 2015-12-14 MED ORDER — WITCH HAZEL-GLYCERIN EX PADS: 1.0000 "application " | MEDICATED_PAD | CUTANEOUS | Status: DC | PRN

## 2015-12-14 MED ORDER — EPHEDRINE 5 MG/ML INJ
10.0000 mg | INTRAVENOUS | Status: DC | PRN
  Filled 2015-12-14: qty 4

## 2015-12-14 MED ORDER — ONDANSETRON HCL 4 MG/2ML IJ SOLN: 4.0000 mg | Freq: Four times a day (QID) | INTRAMUSCULAR | Status: DC | PRN

## 2015-12-14 MED ORDER — LIDOCAINE HCL (PF) 1 % IJ SOLN
30.0000 mL | INTRAMUSCULAR | Status: DC | PRN
  Filled 2015-12-14: qty 30

## 2015-12-14 MED ORDER — LACTATED RINGERS IV SOLN
INTRAVENOUS | Status: DC
  Administered 2015-12-14 (×2): via INTRAVENOUS

## 2015-12-14 MED ORDER — OXYTOCIN 40 UNITS IN LACTATED RINGERS INFUSION - SIMPLE MED
2.5000 [IU]/h | INTRAVENOUS | Status: DC
Start: 2015-12-14 — End: 2015-12-14
  Filled 2015-12-14: qty 1000

## 2015-12-14 MED ORDER — DIPHENHYDRAMINE HCL 50 MG/ML IJ SOLN: 12.5000 mg | INTRAMUSCULAR | Status: DC | PRN

## 2015-12-14 MED ORDER — OXYTOCIN BOLUS FROM INFUSION
500.0000 mL | Freq: Once | INTRAVENOUS | Status: AC
  Administered 2015-12-14: 500 mL via INTRAVENOUS

## 2015-12-14 MED ORDER — PRENATAL MULTIVITAMIN CH
1.0000 | ORAL_TABLET | Freq: Every day | ORAL | Status: DC
  Administered 2015-12-15 – 2015-12-16 (×2): 1 via ORAL
  Filled 2015-12-14 (×2): qty 1

## 2015-12-14 MED ORDER — FLEET ENEMA 7-19 GM/118ML RE ENEM: 1.0000 | ENEMA | RECTAL | Status: DC | PRN

## 2015-12-14 MED ORDER — BUTORPHANOL TARTRATE 2 MG/ML IJ SOLN: 2.0000 mg | INTRAMUSCULAR | Status: DC | PRN

## 2015-12-14 MED ORDER — OXYCODONE-ACETAMINOPHEN 5-325 MG PO TABS: 1.0000 | ORAL_TABLET | ORAL | Status: DC | PRN

## 2015-12-14 MED ORDER — ONDANSETRON HCL 4 MG/2ML IJ SOLN: 4.0000 mg | INTRAMUSCULAR | Status: DC | PRN

## 2015-12-14 MED ORDER — BENZOCAINE-MENTHOL 20-0.5 % EX AERO: 1.0000 "application " | INHALATION_SPRAY | CUTANEOUS | Status: DC | PRN

## 2015-12-14 MED ORDER — ONDANSETRON HCL 4 MG PO TABS: 4.0000 mg | ORAL_TABLET | ORAL | Status: DC | PRN

## 2015-12-14 MED ORDER — PHENYLEPHRINE 40 MCG/ML (10ML) SYRINGE FOR IV PUSH (FOR BLOOD PRESSURE SUPPORT)
80.0000 ug | PREFILLED_SYRINGE | INTRAVENOUS | Status: DC | PRN
  Filled 2015-12-14: qty 5
  Filled 2015-12-14: qty 10

## 2015-12-14 NOTE — Progress Notes (Signed)
Message left regarding pt in MAU and requesting call back.

## 2015-12-14 NOTE — Anesthesia Pain Management Evaluation Note (Signed)
  CRNA Pain Management Visit Note  Patient: Erica Becker, 25 y.o., female  "Hello I am a member of the anesthesia team at [facility]. We have an anesthesia team available at all times to provide care throughout the hospital, including epidural management and anesthesia for C-section. I don't know your plan for the delivery whether it a natural birth, water birth, IV sedation, nitrous supplementation, doula or epidural, but we want to meet your pain goals."   1.Was your pain managed to your expectations on prior hospitalizations?   Yes   2.What is your expectation for pain management during this hospitalization?     Epidural  3.How can we help you reach that goal? Epidural  Record the patient's initial score and the patient's pain goal.   Pain: 0  Pain Goal: 5 [facility] wants you to be able to say your pain was always managed very well.  Quinta Eimer 12/14/2015

## 2015-12-14 NOTE — H&P (Signed)
Erica Becker is a 25 y.o. female presenting for SOL at term.  The patient is currently comfortable with epidural.  No antepartum complications. GBS negative.  OB History    Gravida Para Term Preterm AB Living   2 1       1    SAB TAB Ectopic Multiple Live Births                 Past Medical History:  Diagnosis Date  . Hypertension   . Pregnancy induced hypertension    History reviewed. No pertinent surgical history. Family History: family history is not on file. Social History:  reports that she has never smoked. She has never used smokeless tobacco. She reports that she does not drink alcohol or use drugs.     Maternal Diabetes: No Genetic Screening: Normal Maternal Ultrasounds/Referrals: Normal Fetal Ultrasounds or other Referrals:  None Maternal Substance Abuse:  No Significant Maternal Medications:  None Significant Maternal Lab Results:  Lab values include: Group B Strep negative Other Comments:  None  ROS Maternal Medical History:  Reason for admission: Contractions.   Contractions: Onset was 3-5 hours ago.   Frequency: regular.   Perceived severity is moderate.    Fetal activity: Perceived fetal activity is normal.   Last perceived fetal movement was within the past hour.    Prenatal complications: no prenatal complications Prenatal Complications - Diabetes: none.    Dilation: 5 Effacement (%): 70, 80 Station: -2 Exam by:: Jaber Dunlow  AROM, moderate mec  Blood pressure (!) 112/57, pulse 81, temperature 97.4 F (36.3 C), temperature source Oral, resp. rate 16, height 5\' 6"  (1.676 m), weight 186 lb (84.4 kg), SpO2 100 %. Maternal Exam:  Uterine Assessment: Contraction strength is moderate.  Contraction frequency is regular.   Abdomen: Patient reports no abdominal tenderness. Fundal height is c/w dates.   Estimated fetal weight is 8#.   Fetal presentation: vertex  Introitus: Normal vulva. Pelvis: adequate for delivery.   Cervix: Cervix evaluated by  digital exam.     Physical Exam  Constitutional: She is oriented to person, place, and time. She appears well-developed and well-nourished.  GI: Soft. There is no rebound and no guarding.  Neurological: She is alert and oriented to person, place, and time.  Skin: Skin is warm and dry.  Psychiatric: She has a normal mood and affect. Her behavior is normal.    Prenatal labs: ABO, Rh: A/Positive/-- (05/03 0000) Antibody: Negative (05/03 0000) Rubella: Immune (05/03 0000) RPR: Nonreactive (05/03 0000)  HBsAg: Negative (05/03 0000)  HIV: Non-reactive (05/03 0000)  GBS: Negative (11/09 0000)   Assessment/Plan: 25yo G2P1 at 7359w2d with SOL -Anticipate NSVD   Gotti Alwin 12/14/2015, 8:28 AM

## 2015-12-14 NOTE — Lactation Note (Signed)
Lactation Consultation Note  Patient Name: [name] ZOXWR'UToday's Date: 12/14/2015 Reason for consult: Initial assessment   [facility]. Mom speaks [place] and FOB speaks [place] and [place]. Infant was noted to have hypothermia and was being taken to nursery accompanied by FOB. Dad reports infant fed for a few minutes, infant was noted to have milk in his mouth.   FOB reports mom wants to feed BM and formula, enc them to feed infant 8-12 x in 24 hours at first feeding cues at breast first and then follow with formula as needed. Will need to follow up later with parents as mom was also being readied to be transferred to [facility].    Maternal Data Formula Feeding for Exclusion: Yes Reason for exclusion: Mother's choice to formula and breast feed on admission Does the patient have breastfeeding experience prior to this delivery?: Yes  Feeding    LATCH Score/Interventions                      Lactation Tools Discussed/Used     Consult Status Consult Status: Follow-up Date: 12/14/15 Follow-up type: In-patient    Silas FloodSharon S Billie Trager 12/14/2015, 1:00 PM

## 2015-12-14 NOTE — Anesthesia Preprocedure Evaluation (Signed)
Anesthesia Evaluation  Patient identified by MRN, date of birth, ID band Patient awake    Reviewed: Allergy & Precautions, H&P , NPO status , Patient's Chart, lab work & pertinent test results  Airway Mallampati: I  TM Distance: >3 FB Neck ROM: full    Dental no notable dental hx.    Pulmonary neg pulmonary ROS,    Pulmonary exam normal        Cardiovascular hypertension, Normal cardiovascular exam     Neuro/Psych negative neurological ROS  negative psych ROS   GI/Hepatic negative GI ROS, Neg liver ROS,   Endo/Other  negative endocrine ROS  Renal/GU negative Renal ROS     Musculoskeletal   Abdominal Normal abdominal exam  (+)   Peds  Hematology negative hematology ROS (+)   Anesthesia Other Findings   Reproductive/Obstetrics (+) Pregnancy                             Anesthesia Physical Anesthesia Plan  ASA: II  Anesthesia Plan: Epidural   Post-op Pain Management:    Induction:   Airway Management Planned:   Additional Equipment:   Intra-op Plan:   Post-operative Plan:   Informed Consent: I have reviewed the patients History and Physical, chart, labs and discussed the procedure including the risks, benefits and alternatives for the proposed anesthesia with the patient or authorized representative who has indicated his/her understanding and acceptance.     Plan Discussed with:   Anesthesia Plan Comments:         Anesthesia Quick Evaluation

## 2015-12-14 NOTE — Lactation Note (Signed)
This note was copied from a baby's chart. Lactation Consultation Note  Patient Name: Erica Becker EAVWU'JToday's Date: 12/14/2015 Reason for consult: Follow-up assessment   Follow up with parents on PPU. Mom was holding infant and says he recently BF. Mom demonstrated hand expression and large gtts colostrum easily expressible. Parents plan to breast and formula feed. Parents are Muslim and infant should not receive Alimentum, as not Halal. Enc mom to BF infant STS 8-12 x in 24 hours. Enc mom to BF prior to offering formula to stimulate milk production.   [facility] and plans to call and make an appt post d/c. Mom does not have a pump at home. BF Resources Handout and LC Brochure given, parents informed of IP/OP Services, BF Support Groups and LC phone #. Enc mom to call out to desk for feeding assistance as needed. Follow up tomorrow and prn.    Maternal Data Formula Feeding for Exclusion: Yes Reason for exclusion: Mother's choice to formula and breast feed on admission Has patient been taught Hand Expression?: Yes Does the patient have breastfeeding experience prior to this delivery?: Yes  Feeding    LATCH Score/Interventions                      Lactation Tools Discussed/Used WIC Program: Yes   Consult Status Consult Status: Follow-up Date: 12/15/15 Follow-up type: In-patient    Silas FloodSharon S Delron Comer 12/14/2015, 3:52 PM

## 2015-12-14 NOTE — Anesthesia Postprocedure Evaluation (Signed)
Anesthesia Post Note  Patient: [name]  Procedure(s) Performed: * No procedures listed *  Patient location during evaluation: Mother Baby Anesthesia Type: Epidural Level of consciousness: awake and alert and oriented Pain management: satisfactory to patient Vital Signs Assessment: post-procedure vital signs reviewed and stable Respiratory status: spontaneous breathing and nonlabored ventilation Cardiovascular status: stable Postop Assessment: no headache, no backache, no signs of nausea or vomiting, adequate PO intake and patient able to bend at knees (patient up walking) Anesthetic complications: no     Last Vitals:  Vitals:   12/14/15 1330 12/14/15 1430  BP: 111/71 111/73  Pulse: 78 81  Resp: 16 12  Temp: 36.8 C 37.1 C    Last Pain:  Vitals:   12/14/15 1430  TempSrc: Oral  PainSc: 0-No pain   Pain Goal:                 Viviana Trimble

## 2015-12-14 NOTE — MAU Note (Signed)
Patient presents with ctx that started at midnight. Patient denies any bleeding or LOF.

## 2015-12-15 LAB — CBC
HCT: 27.8 % — ABNORMAL LOW (ref 36.0–46.0)
Hemoglobin: 9.6 g/dL — ABNORMAL LOW (ref 12.0–15.0)
MCH: 29.3 pg (ref 26.0–34.0)
MCHC: 34.5 g/dL (ref 30.0–36.0)
MCV: 84.8 fL (ref 78.0–100.0)
PLATELETS: 201 10*3/uL (ref 150–400)
RBC: 3.28 MIL/uL — AB (ref 3.87–5.11)
RDW: 13.9 % (ref 11.5–15.5)
WBC: 10.4 10*3/uL (ref 4.0–10.5)

## 2015-12-15 MED ORDER — DOCUSATE SODIUM 100 MG PO CAPS
100.0000 mg | ORAL_CAPSULE | Freq: Every day | ORAL | Status: DC
  Administered 2015-12-15 – 2015-12-16 (×2): 100 mg via ORAL
  Filled 2015-12-15 (×2): qty 1

## 2015-12-15 NOTE — Progress Notes (Signed)
Post Partum Day 1 Subjective: no complaints, up ad lib, voiding, tolerating PO and + flatus  Objective: Blood pressure 115/63, pulse 79, temperature 98.2 F (36.8 C), temperature source Oral, resp. rate 18, height 5\' 6"  (1.676 m), weight 186 lb (84.4 kg), SpO2 100 %, unknown if currently breastfeeding.  Physical Exam:  General: alert and cooperative Lochia: appropriate Uterine Fundus: firm Incision: healing well DVT Evaluation: No evidence of DVT seen on physical exam. Negative Homan's sign. No cords or calf tenderness. No significant calf/ankle edema.   Recent Labs  12/14/15 0500 12/15/15 0527  HGB 12.1 9.6*  HCT 34.8* 27.8*    Assessment/Plan: Plan for discharge tomorrow, uncertain regarding circ   LOS: 1 day   CURTIS,CAROL G 12/15/2015, 8:07 AM

## 2015-12-15 NOTE — Lactation Note (Signed)
This note was copied from a baby's chart. Lactation Consultation Note  Patient Name: Erica Becker OZDGU'YToday's Date: 12/15/2015 Reason for consult: Follow-up assessment   Follow up with mom of 29 hour old infant. Infant currently asleep in crib. Infant with 10 BF for 10-20 minutes, 4 voids, 3 stools and 3 emesis episodes in 24 hours preceding this assessment. Infant weight 6 lb 5.1 oz with 2% weight loss since birth. LATCH scores 9.   Mom reports BF is going well today. She reports she feels fuller today. She denies nipple pain or tenderness. FOB voiced concerns that MOB does not have enough milk as infant fed for 10 minutes and then wanted to feed again within the hour. Discussed normalcy of cluster feeding. Enc parents to call for feeding assistance as needed. No further questions were asked. Follow up tomorrow and prn.    Maternal Data Formula Feeding for Exclusion: Yes Reason for exclusion: Mother's choice to formula and breast feed on admission Has patient been taught Hand Expression?: Yes Does the patient have breastfeeding experience prior to this delivery?: Yes  Feeding Feeding Type: Breast Fed Length of feed: 10 min  LATCH Score/Interventions                      Lactation Tools Discussed/Used     Consult Status Consult Status: Follow-up Date: 12/16/15 Follow-up type: In-patient    Silas FloodSharon S Naseer Hearn 12/15/2015, 5:44 PM

## 2015-12-16 MED ORDER — OXYCODONE-ACETAMINOPHEN 5-325 MG PO TABS: 1.0000 | ORAL_TABLET | Freq: Four times a day (QID) | ORAL | 0 refills | Status: DC | PRN

## 2015-12-16 MED ORDER — FERROUS SULFATE 325 (65 FE) MG PO TBEC: 325.0000 mg | DELAYED_RELEASE_TABLET | Freq: Two times a day (BID) | ORAL | 2 refills | Status: DC

## 2015-12-16 MED ORDER — IBUPROFEN 600 MG PO TABS: 600.0000 mg | ORAL_TABLET | Freq: Four times a day (QID) | ORAL | 0 refills | Status: DC | PRN

## 2015-12-16 MED ORDER — DOCUSATE SODIUM 100 MG PO CAPS: 100.0000 mg | ORAL_CAPSULE | Freq: Two times a day (BID) | ORAL | 2 refills | Status: DC

## 2015-12-16 NOTE — Lactation Note (Signed)
This note was copied from a baby's chart. Lactation Consultation Note  Denies problems or questions. Encouraged mother to bf before offering formula to establish milk supply. Reviewed engorgement care and monitoring voids/stools.   Patient Name: Erica [name] WUJWJ'XToday's Date: 12/16/2015 Reason for consult: Follow-up assessment   Maternal Data    Feeding    LATCH Score/Interventions                      Lactation Tools Discussed/Used     Consult Status Consult Status: Complete    Hardie PulleyBerkelhammer, Ruth Boschen 12/16/2015, 12:37 PM

## 2015-12-16 NOTE — Discharge Summary (Signed)
Obstetric Discharge Summary Reason for Admission: onset of labor Prenatal Procedures: none Intrapartum Procedures: spontaneous vaginal delivery Postpartum Procedures: none Complications-Operative and Postpartum: none Hemoglobin  Date Value Ref Range Status  12/15/2015 9.6 (L) 12.0 - 15.0 g/dL Final    Comment:    DELTA CHECK NOTED REPEATED TO VERIFY    HCT  Date Value Ref Range Status  12/15/2015 27.8 (L) 36.0 - 46.0 % Final    Physical Exam:  General: alert, cooperative and appears stated age Lochia: appropriate Uterine Fundus: firm DVT Evaluation: No evidence of DVT seen on physical exam. Negative Homan's sign. No cords or calf tenderness.  Discharge Diagnoses: Term Pregnancy-delivered  Discharge Information: Date: 12/16/2015 Activity: pelvic rest Diet: routine Medications: Ibuprofen, Colace, Iron and Percocet Condition: stable and improved Instructions: refer to practice specific booklet Discharge to: home   Newborn Data: Live born female  Birth Weight: 6 lb 6.8 oz (2915 g) APGAR: 9, 9  Home with mother.  Ranae Pilalise Jennifer Charnese Federici 12/16/2015, 9:23 AM

## 2015-12-16 NOTE — Progress Notes (Signed)
S: No complaints. Feeling well. Lochia appropriate. No subjective fevers/chills.   O:  Vitals:   12/15/15 1857 12/16/15 0614  BP: 112/70 124/74  Pulse: 71 69  Resp: 18 18  Temp: 98.1 F (36.7 C) 98 F (36.7 C)    Gen: NAD, A&O Pulm: NWOB Abd: soft, appropriately ttp, fundus firm and below Umb Ext: No evidence of DVT, trace edema b/l   A/P:  PPD2 s/p SVD, doing well pp. AFVSS. Benign exam.  No sig med/surg issues.  Continue present care. Plan for d/c today after circ.   Belva AgeeElise Rainie Crenshaw MD

## 2016-02-02 MED ORDER — FENTANYL 2.5 MCG/ML BUPIVACAINE 1/10 % EPIDURAL INFUSION (WH - ANES)
INTRAMUSCULAR | Status: DC | PRN
  Administered 2015-12-14: 14 mL/h via EPIDURAL

## 2016-02-02 MED ORDER — LIDOCAINE HCL (PF) 1 % IJ SOLN
INTRAMUSCULAR | Status: DC | PRN
  Administered 2015-12-14 (×2): 7 mL via EPIDURAL

## 2016-02-02 NOTE — Anesthesia Procedure Notes (Signed)
Epidural Patient location during procedure: OB Start time: 12/14/2015 5:40 AM End time: 12/14/2015 5:44 AM  Staffing Anesthesiologist: Leilani AbleHATCHETT, Dorita Rowlands  Preanesthetic Checklist Completed: patient identified, surgical consent, pre-op evaluation, timeout performed, IV checked, risks and benefits discussed and monitors and equipment checked  Epidural Patient position: sitting Prep: site prepped and draped and DuraPrep Patient monitoring: continuous pulse ox and blood pressure Approach: midline Location: L3-L4 Injection technique: LOR air  Needle:  Needle type: Tuohy  Needle gauge: 17 G Needle length: 9 cm and 9 Needle insertion depth: 5 cm cm Catheter type: closed end flexible Catheter size: 19 Gauge Catheter at skin depth: 10 cm Test dose: negative and Other  Assessment Sensory level: T9 Events: blood not aspirated, injection not painful, no injection resistance, negative IV test and no paresthesia  Additional Notes Reason for block:procedure for pain

## 2017-03-20 ENCOUNTER — Other Ambulatory Visit: Payer: Self-pay

## 2017-03-20 ENCOUNTER — Emergency Department (HOSPITAL_BASED_OUTPATIENT_CLINIC_OR_DEPARTMENT_OTHER): Payer: Commercial Managed Care - PPO

## 2017-03-20 ENCOUNTER — Emergency Department (HOSPITAL_BASED_OUTPATIENT_CLINIC_OR_DEPARTMENT_OTHER)
Admission: EM | Admit: 2017-03-20 | Discharge: 2017-03-21 | Disposition: A | Discharge status: Home / Self Care | Payer: Commercial Managed Care - PPO | Attending: Emergency Medicine | Admitting: Emergency Medicine

## 2017-03-20 ENCOUNTER — Encounter (HOSPITAL_BASED_OUTPATIENT_CLINIC_OR_DEPARTMENT_OTHER): Payer: Self-pay | Admitting: *Deleted

## 2017-03-20 DIAGNOSIS — Z79899 Other long term (current) drug therapy: Secondary | ICD-10-CM | POA: Diagnosis not present

## 2017-03-20 DIAGNOSIS — I10 Essential (primary) hypertension: Secondary | ICD-10-CM | POA: Insufficient documentation

## 2017-03-20 DIAGNOSIS — K219 Gastro-esophageal reflux disease without esophagitis: Secondary | ICD-10-CM | POA: Diagnosis not present

## 2017-03-20 DIAGNOSIS — R079 Chest pain, unspecified: Secondary | ICD-10-CM | POA: Diagnosis present

## 2017-03-20 NOTE — ED Triage Notes (Signed)
Sob today. She is ambulatory and speaks with no difficulty. Lungs are clear.

## 2017-03-21 LAB — PREGNANCY, URINE: Preg Test, Ur: NEGATIVE

## 2017-03-21 MED ORDER — PANTOPRAZOLE SODIUM 40 MG PO TBEC
40.0000 mg | DELAYED_RELEASE_TABLET | Freq: Once | ORAL | Status: AC
  Administered 2017-03-21: 40 mg via ORAL
  Filled 2017-03-21: qty 1

## 2017-03-21 MED ORDER — GI COCKTAIL ~~LOC~~
30.0000 mL | Freq: Once | ORAL | Status: AC
  Administered 2017-03-21: 30 mL via ORAL
  Filled 2017-03-21: qty 30

## 2017-03-21 MED ORDER — PANTOPRAZOLE SODIUM 40 MG PO TBEC: 40.0000 mg | DELAYED_RELEASE_TABLET | Freq: Every day | ORAL | 1 refills | Status: DC

## 2017-03-21 NOTE — ED Provider Notes (Signed)
TIME SEEN: 1:27 AM  CHIEF COMPLAINT: Chest pain; shortness of breath  HPI: Patient is a 27 year old female with no significant past medical history who presents emergency department with chest pain that she describes as a burning pain that feels like heartburn with shortness of breath.  States she tried Advil at home tonight without any relief.  Also has tried Tums with no relief.  Symptoms worse with lying flat.  Pain is not exertional or pleuritic.  No lower extremity swelling or pain.  No nausea or vomiting.  No abdominal pain.  No history of PE, DVT, exogenous estrogen use, recent fractures, surgery, trauma, hospitalization or prolonged travel. No lower extremity swelling or pain. No calf tenderness.  Patient reports she did have a child a year and a half ago.  Denies fever or cough.  [place] interpreter used throughout visit.  ROS: See HPI Constitutional: no fever  Eyes: no drainage  ENT: no runny nose   Cardiovascular:   chest pain  Resp: SOB  GI: no vomiting GU: no dysuria Integumentary: no rash  Allergy: no hives  Musculoskeletal: no leg swelling  Neurological: no slurred speech ROS otherwise negative  PAST MEDICAL HISTORY/PAST SURGICAL HISTORY:  Past Medical History:  Diagnosis Date  . Hypertension   . Pregnancy induced hypertension     MEDICATIONS:  Prior to Admission medications   Medication Sig Start Date End Date Taking? Authorizing Provider  docusate sodium (COLACE) 100 MG capsule Take 1 capsule (100 mg total) by mouth 2 (two) times daily. 12/16/15   Ranae Pila, MD  ferrous sulfate 325 (65 FE) MG EC tablet Take 1 tablet (325 mg total) by mouth 2 (two) times daily. 12/16/15   Ranae Pila, MD  ibuprofen (ADVIL,MOTRIN) 600 MG tablet Take 1 tablet (600 mg total) by mouth every 6 (six) hours as needed. 12/16/15   Ranae Pila, MD  oxyCODONE-acetaminophen (PERCOCET/ROXICET) 5-325 MG tablet Take 1 tablet by mouth every 6 (six) hours as needed for  severe pain. 12/16/15   Ranae Pila, MD  medroxyPROGESTERone (PROVERA) 10 MG tablet Take 1 tablet (10 mg total) by mouth daily. Patient not taking: Reported on 03/08/2014 01/23/13 03/08/14  Dione Booze, MD    ALLERGIES:  No Known Allergies  SOCIAL HISTORY:  Social History   Tobacco Use  . Smoking status: Never Smoker  . Smokeless tobacco: Never Used  Substance Use Topics  . Alcohol use: No    FAMILY HISTORY: No family history on file.  EXAM: BP 112/77   Pulse 61   Temp 97.9 F (36.6 C) (Oral)   Resp 18   Ht 5\' 6"  (1.676 m)   Wt 77.3 kg (170 lb 8 oz)   LMP 03/08/2017   SpO2 100%   BMI 27.52 kg/m  CONSTITUTIONAL: Alert and oriented and responds appropriately to questions. Well-appearing; well-nourished HEAD: Normocephalic EYES: Conjunctivae clear, pupils appear equal, EOMI ENT: normal nose; moist mucous membranes NECK: Supple, no meningismus, no nuchal rigidity, no LAD  CARD: RRR; S1 and S2 appreciated; no murmurs, no clicks, no rubs, no gallops RESP: Normal chest excursion without splinting or tachypnea; breath sounds clear and equal bilaterally; no wheezes, no rhonchi, no rales, no hypoxia or respiratory distress, speaking full sentences ABD/GI: Normal bowel sounds; non-distended; soft, non-tender, no rebound, no guarding, no peritoneal signs, no hepatosplenomegaly BACK:  The back appears normal and is non-tender to palpation, there is no CVA tenderness EXT: Normal ROM in all joints; non-tender to palpation; no edema; normal capillary  refill; no cyanosis, no calf tenderness or swelling    SKIN: Normal color for age and race; warm; no rash NEURO: Moves all extremities equally PSYCH: The patient's mood and manner are appropriate. Grooming and personal hygiene are appropriate.  MEDICAL DECISION MAKING: Patient here with atypical chest pain and shortness of breath.  Doubt ACS, PE, dissection.  Chest x-ray clear with no pneumonia, edema.  Mediastinum appears normal.   No pneumothorax.  EKG shows no ischemic abnormality, arrhythmia or interval abnormality.  Suspect heartburn.  Will treat with Protonix, GI cocktail and reassess.  ED PROGRESS: Patient reports feeling better.  Will discharge with prescription of Protonix.  Again suspect GERD.  At this time, I do not feel there is any life-threatening condition present. I have reviewed and discussed all results (EKG, imaging, lab, urine as appropriate) and exam findings with patient/family. I have reviewed nursing notes and appropriate previous records.  I feel the patient is safe to be discharged home without further emergent workup and can continue workup as an outpatient as needed. Discussed usual and customary return precautions. Patient/family verbalize understanding and are comfortable with this plan.  Outpatient follow-up has been provided if needed. All questions have been answered.      EKG Interpretation  Date/Time:  Friday March 21 2017 01:59:53 EST Ventricular Rate:  61 PR Interval:    QRS Duration: 84 QT Interval:  426 QTC Calculation: 430 R Axis:   44 Text Interpretation:  Sinus rhythm No old tracing to compare Confirmed by Reality Dejonge, Baxter HireKristen [phone]) on 03/21/2017 2:07:28 AM          Lori-Ann Lindfors, Layla MawKristen N, DO 03/21/17 [id number]

## 2017-03-21 NOTE — Discharge Instructions (Signed)
Please avoid NSAIDs such as aspirin (Goody powders), ibuprofen (Motrin, Advil), naproxen (Aleve) as these may worsen your symptoms.  Tylenol 1000 mg every 6 hours is safe to take as long as you have no history of liver problems (heavy alcohol use, cirrhosis, hepatitis).  Please avoid spicy, acidic (citrus fruits, tomato based sauces, salsa), greasy, fatty foods.  Please avoid caffeine and alcohol.     You may use over-the-counter medications such as Pepcid or Zantac, Tums, Mylanta or Maalox.

## 2017-03-21 NOTE — ED Notes (Signed)
ED Provider at bedside. 

## 2017-03-21 NOTE — ED Notes (Signed)
Pts husband verbalized understanding of discharge information and rx. No acute distress noted. Pt getting dressed.

## 2018-02-18 ENCOUNTER — Encounter (HOSPITAL_BASED_OUTPATIENT_CLINIC_OR_DEPARTMENT_OTHER): Payer: Self-pay | Admitting: Emergency Medicine

## 2018-02-18 ENCOUNTER — Other Ambulatory Visit: Payer: Self-pay

## 2018-02-18 ENCOUNTER — Emergency Department (HOSPITAL_BASED_OUTPATIENT_CLINIC_OR_DEPARTMENT_OTHER)
Admission: EM | Admit: 2018-02-18 | Discharge: 2018-02-18 | Disposition: A | Discharge status: Home / Self Care | Payer: No Typology Code available for payment source | Attending: Emergency Medicine | Admitting: Emergency Medicine

## 2018-02-18 ENCOUNTER — Emergency Department (HOSPITAL_BASED_OUTPATIENT_CLINIC_OR_DEPARTMENT_OTHER): Payer: No Typology Code available for payment source

## 2018-02-18 DIAGNOSIS — I10 Essential (primary) hypertension: Secondary | ICD-10-CM | POA: Diagnosis not present

## 2018-02-18 DIAGNOSIS — Z79899 Other long term (current) drug therapy: Secondary | ICD-10-CM | POA: Diagnosis not present

## 2018-02-18 DIAGNOSIS — R0789 Other chest pain: Secondary | ICD-10-CM | POA: Insufficient documentation

## 2018-02-18 DIAGNOSIS — R51 Headache: Secondary | ICD-10-CM | POA: Diagnosis not present

## 2018-02-18 DIAGNOSIS — R079 Chest pain, unspecified: Secondary | ICD-10-CM | POA: Diagnosis present

## 2018-02-18 LAB — CBC
HCT: 38.7 % (ref 36.0–46.0)
Hemoglobin: 12.4 g/dL (ref 12.0–15.0)
MCH: 28.7 pg (ref 26.0–34.0)
MCHC: 32 g/dL (ref 30.0–36.0)
MCV: 89.6 fL (ref 80.0–100.0)
NRBC: 0 % (ref 0.0–0.2)
Platelets: 342 10*3/uL (ref 150–400)
RBC: 4.32 MIL/uL (ref 3.87–5.11)
RDW: 12.2 % (ref 11.5–15.5)
WBC: 6.5 10*3/uL (ref 4.0–10.5)

## 2018-02-18 LAB — BASIC METABOLIC PANEL
ANION GAP: 4 — AB (ref 5–15)
BUN: 15 mg/dL (ref 6–20)
CO2: 22 mmol/L (ref 22–32)
Calcium: 8 mg/dL — ABNORMAL LOW (ref 8.9–10.3)
Chloride: 107 mmol/L (ref 98–111)
Creatinine, Ser: 0.64 mg/dL (ref 0.44–1.00)
Glucose, Bld: 90 mg/dL (ref 70–99)
Potassium: 3.7 mmol/L (ref 3.5–5.1)
SODIUM: 133 mmol/L — AB (ref 135–145)

## 2018-02-18 LAB — PREGNANCY, URINE: PREG TEST UR: NEGATIVE

## 2018-02-18 LAB — TROPONIN I

## 2018-02-18 MED ORDER — ACETAMINOPHEN 500 MG PO TABS
1000.0000 mg | ORAL_TABLET | Freq: Once | ORAL | Status: AC
  Administered 2018-02-18: 1000 mg via ORAL
  Filled 2018-02-18: qty 2

## 2018-02-18 MED ORDER — ONDANSETRON 4 MG PO TBDP
4.0000 mg | ORAL_TABLET | Freq: Once | ORAL | Status: AC
  Administered 2018-02-18: 4 mg via ORAL
  Filled 2018-02-18: qty 1

## 2018-02-18 MED ORDER — ALUM & MAG HYDROXIDE-SIMETH 200-200-20 MG/5ML PO SUSP
30.0000 mL | Freq: Once | ORAL | Status: AC
  Administered 2018-02-18: 30 mL via ORAL
  Filled 2018-02-18: qty 30

## 2018-02-18 MED ORDER — LIDOCAINE VISCOUS HCL 2 % MT SOLN
15.0000 mL | Freq: Once | OROMUCOSAL | Status: AC
  Administered 2018-02-18: 15 mL via ORAL
  Filled 2018-02-18: qty 15

## 2018-02-18 MED ORDER — SODIUM CHLORIDE 0.9% FLUSH
3.0000 mL | Freq: Once | INTRAVENOUS | Status: DC
  Filled 2018-02-18: qty 3

## 2018-02-18 NOTE — ED Provider Notes (Signed)
TIME SEEN: 2:38 AM  CHIEF COMPLAINT: Headache, chest pain  HPI: Patient is a 28 year old female with history of pregnancy-induced hypertension who presents to the emergency department with headache that started today.  Took ibuprofen at home and headache improved but not completely resolved.  No head injury.  No numbness, tingling or focal weakness.  Also complaining of burning in her chest that goes into her throat.  No shortness of breath, nausea, vomiting, bloody stools, melena, abdominal pain.  States pain worse after eating.  No history of PE, DVT, exogenous estrogen use, recent fractures, surgery, trauma, hospitalization or prolonged travel. No lower extremity swelling or pain. No calf tenderness.   Patient refuses [place] interpreter.  ROS: See HPI Constitutional: no fever  Eyes: no drainage  ENT: no runny nose   Cardiovascular:   chest pain  Resp: no SOB  GI: no vomiting GU: no dysuria Integumentary: no rash  Allergy: no hives  Musculoskeletal: no leg swelling  Neurological: no slurred speech ROS otherwise negative  PAST MEDICAL HISTORY/PAST SURGICAL HISTORY:  Past Medical History:  Diagnosis Date  . Hypertension   . Pregnancy induced hypertension     MEDICATIONS:  Prior to Admission medications   Medication Sig Start Date End Date Taking? Authorizing Provider  docusate sodium (COLACE) 100 MG capsule Take 1 capsule (100 mg total) by mouth 2 (two) times daily. 12/16/15   Ranae Pila, MD  ferrous sulfate 325 (65 FE) MG EC tablet Take 1 tablet (325 mg total) by mouth 2 (two) times daily. 12/16/15   Ranae Pila, MD  ibuprofen (ADVIL,MOTRIN) 600 MG tablet Take 1 tablet (600 mg total) by mouth every 6 (six) hours as needed. 12/16/15   Ranae Pila, MD  oxyCODONE-acetaminophen (PERCOCET/ROXICET) 5-325 MG tablet Take 1 tablet by mouth every 6 (six) hours as needed for severe pain. 12/16/15   Ranae Pila, MD  pantoprazole (PROTONIX) 40 MG tablet  Take 1 tablet (40 mg total) by mouth daily. 03/21/17   Jermine Bibbee, Layla Maw, DO    ALLERGIES:  No Known Allergies  SOCIAL HISTORY:  Social History   Tobacco Use  . Smoking status: Never Smoker  . Smokeless tobacco: Never Used  Substance Use Topics  . Alcohol use: No    FAMILY HISTORY: No family history on file.  EXAM: Pulse 71   Temp 98.1 F (36.7 C) (Oral)   Resp 19   Ht 5\' 4"  (1.626 m)   Wt 72.6 kg   LMP 01/23/2018   SpO2 99%   BMI 27.46 kg/m  CONSTITUTIONAL: Alert and oriented and responds appropriately to questions. Well-appearing; well-nourished HEAD: Normocephalic EYES: Conjunctivae clear, pupils appear equal, EOMI ENT: normal nose; moist mucous membranes NECK: Supple, no meningismus, no nuchal rigidity, no LAD  CARD: RRR; S1 and S2 appreciated; no murmurs, no clicks, no rubs, no gallops RESP: Normal chest excursion without splinting or tachypnea; breath sounds clear and equal bilaterally; no wheezes, no rhonchi, no rales, no hypoxia or respiratory distress, speaking full sentences ABD/GI: Normal bowel sounds; non-distended; soft, non-tender, no rebound, no guarding, no peritoneal signs, no hepatosplenomegaly BACK:  The back appears normal and is non-tender to palpation, there is no CVA tenderness EXT: Normal ROM in all joints; non-tender to palpation; no edema; normal capillary refill; no cyanosis, no calf tenderness or swelling    SKIN: Normal color for age and race; warm; no rash NEURO: Moves all extremities equally, normal speech, normal gait, no facial asymmetry, normal sensation diffusely PSYCH: The patient's  mood and manner are appropriate. Grooming and personal hygiene are appropriate.  MEDICAL DECISION MAKING: Patient here with complaints of headache.  Seems very mild in nature without neurologic deficits.  Improved with ibuprofen at home.  Will give Tylenol here.  Doubt intracranial hemorrhage, stroke, meningitis.  Patient's major complaint is chest pain.  This  seems to be GI in nature.  Describes it is worse after eating, burning pain without shortness of breath, nausea or vomiting.  Abdominal exam benign.  Family at bedside agrees that this is likely heartburn.  Tried no over-the-counter medications prior to arrival.  Labs in triage unremarkable including negative troponin.  Chest x-ray clear.  EKG shows no ischemic abnormality.  She is PERC negative.  Doubt ACS, PE or dissection.  Will try GI cocktail, Zofran and reassess.  ED PROGRESS: Patient reports feeling much better.  Symptoms have completely resolved.  Pregnancy test negative.  I discussed with patient and family that she can use over-the-counter medications as needed for headache as well as over-the-counter medications as needed for heartburn.  Does not get heartburn frequently.  I do not feel she needs to be on a PPI chronically at this time.  Discussed return precautions.  At this time, I do not feel there is any life-threatening condition present. I have reviewed and discussed all results (EKG, imaging, lab, urine as appropriate) and exam findings with patient/family. I have reviewed nursing notes and appropriate previous records.  I feel the patient is safe to be discharged home without further emergent workup and can continue workup as an outpatient as needed. Discussed usual and customary return precautions. Patient/family verbalize understanding and are comfortable with this plan.  Outpatient follow-up has been provided as needed. All questions have been answered.      EKG Interpretation  Date/Time:  Wednesday February 18 2018 01:26:23 EST Ventricular Rate:  72 PR Interval:  146 QRS Duration: 74 QT Interval:  386 QTC Calculation: 422 R Axis:   36 Text Interpretation:  Normal sinus rhythm Normal ECG No significant change since last tracing Confirmed by Icela Glymph, Baxter HireKristen [phone]([id number]) on 02/18/2018 1:29:39 AM         Alejandra Barna, Layla MawKristen N, DO 02/18/18 [id number]

## 2018-02-18 NOTE — Discharge Instructions (Addendum)
Please avoid NSAIDs such as aspirin (Goody powders), ibuprofen (Motrin, Advil), naproxen (Aleve) as these may worsen your symptoms.  Tylenol 1000 mg every 6 hours is safe to take as long as you have no history of liver problems (heavy alcohol use, cirrhosis, hepatitis).  Please avoid spicy, acidic (citrus fruits, tomato based sauces, salsa), greasy, fatty foods.  Please avoid caffeine and alcohol.    You may use over-the-counter medications to help with heartburn such as Tums, Zantac, Maalox, Prilosec.

## 2018-02-18 NOTE — ED Triage Notes (Signed)
Pt c/o 8/10 central cp since yesterday morning with a HA.

## 2019-09-17 IMAGING — DX DG CHEST 2V
3 series · 3 of 3 positions shown · non-contrast
Comparison: 03/20/2017

CLINICAL DATA: Chest pain for 2 days

EXAM:
CHEST - 2 VIEW

[chest pa (1 of 2)]
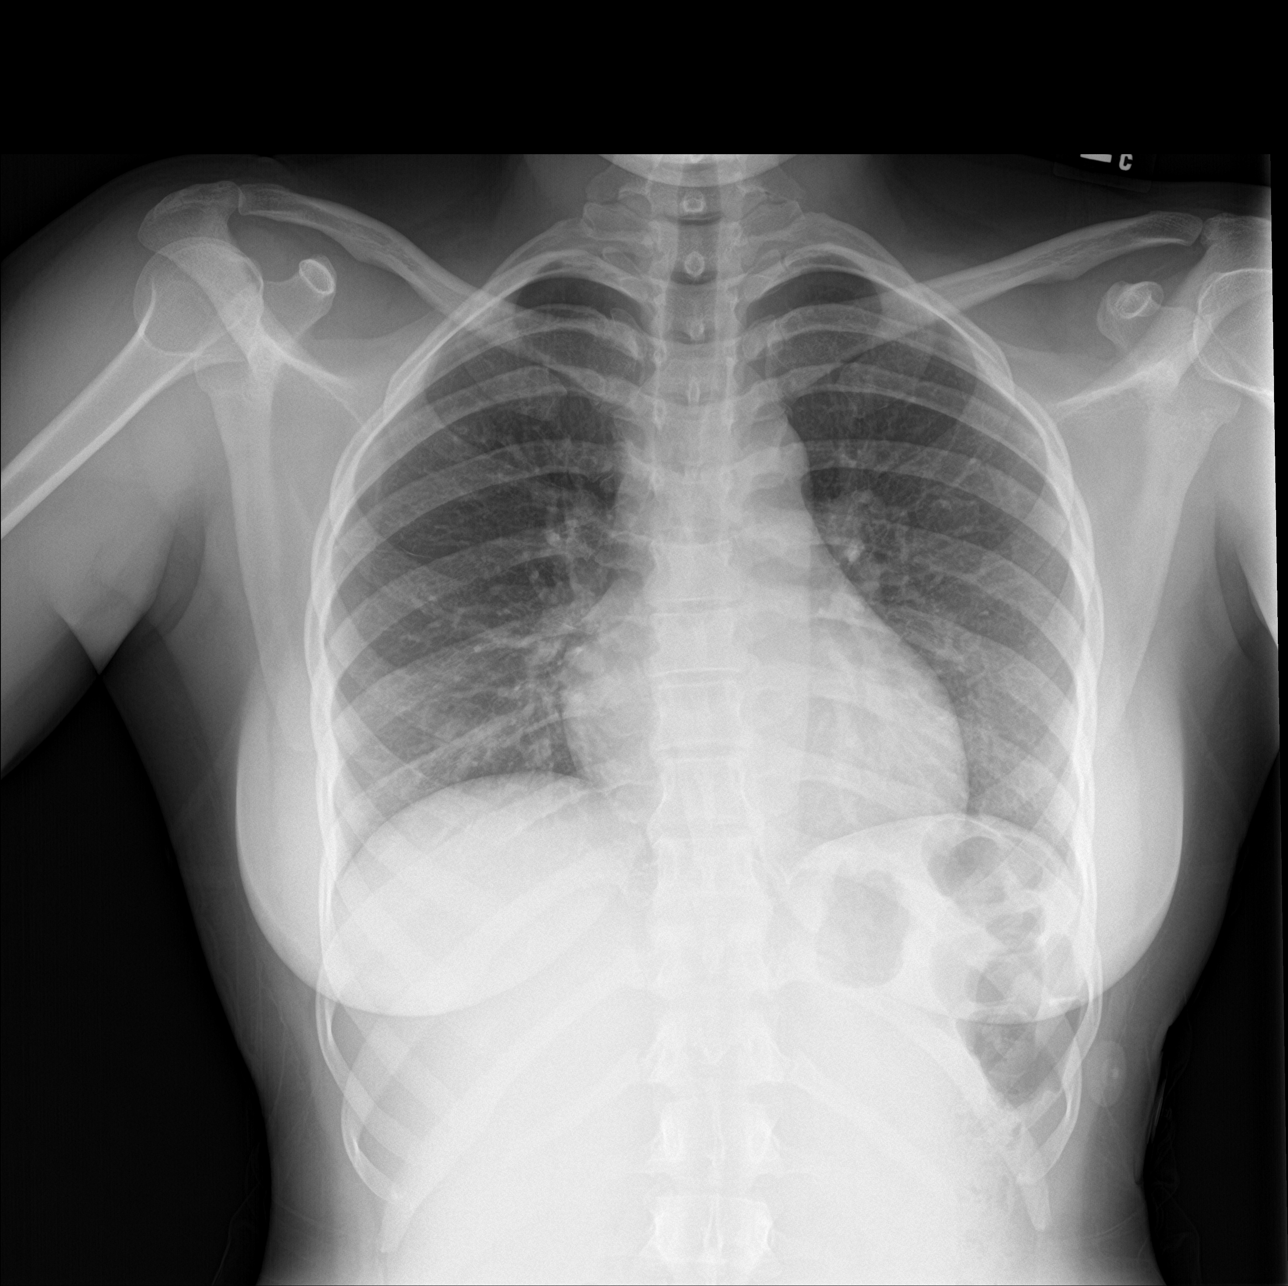

[chest lat]
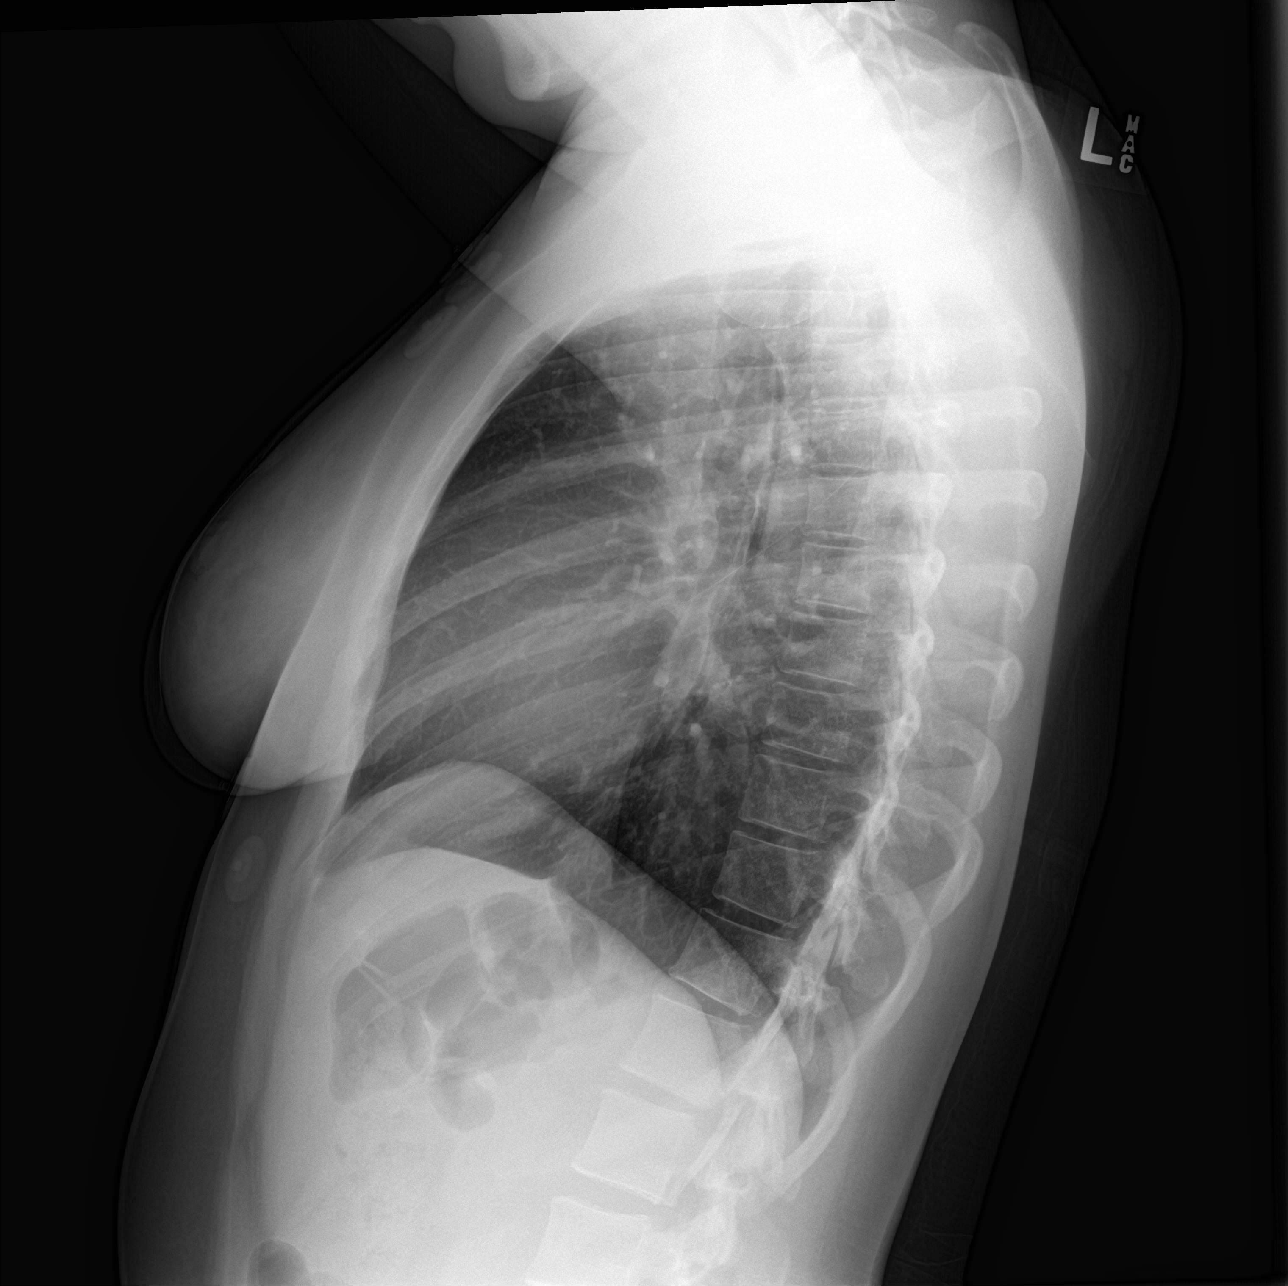

[chest pa (2 of 2)]
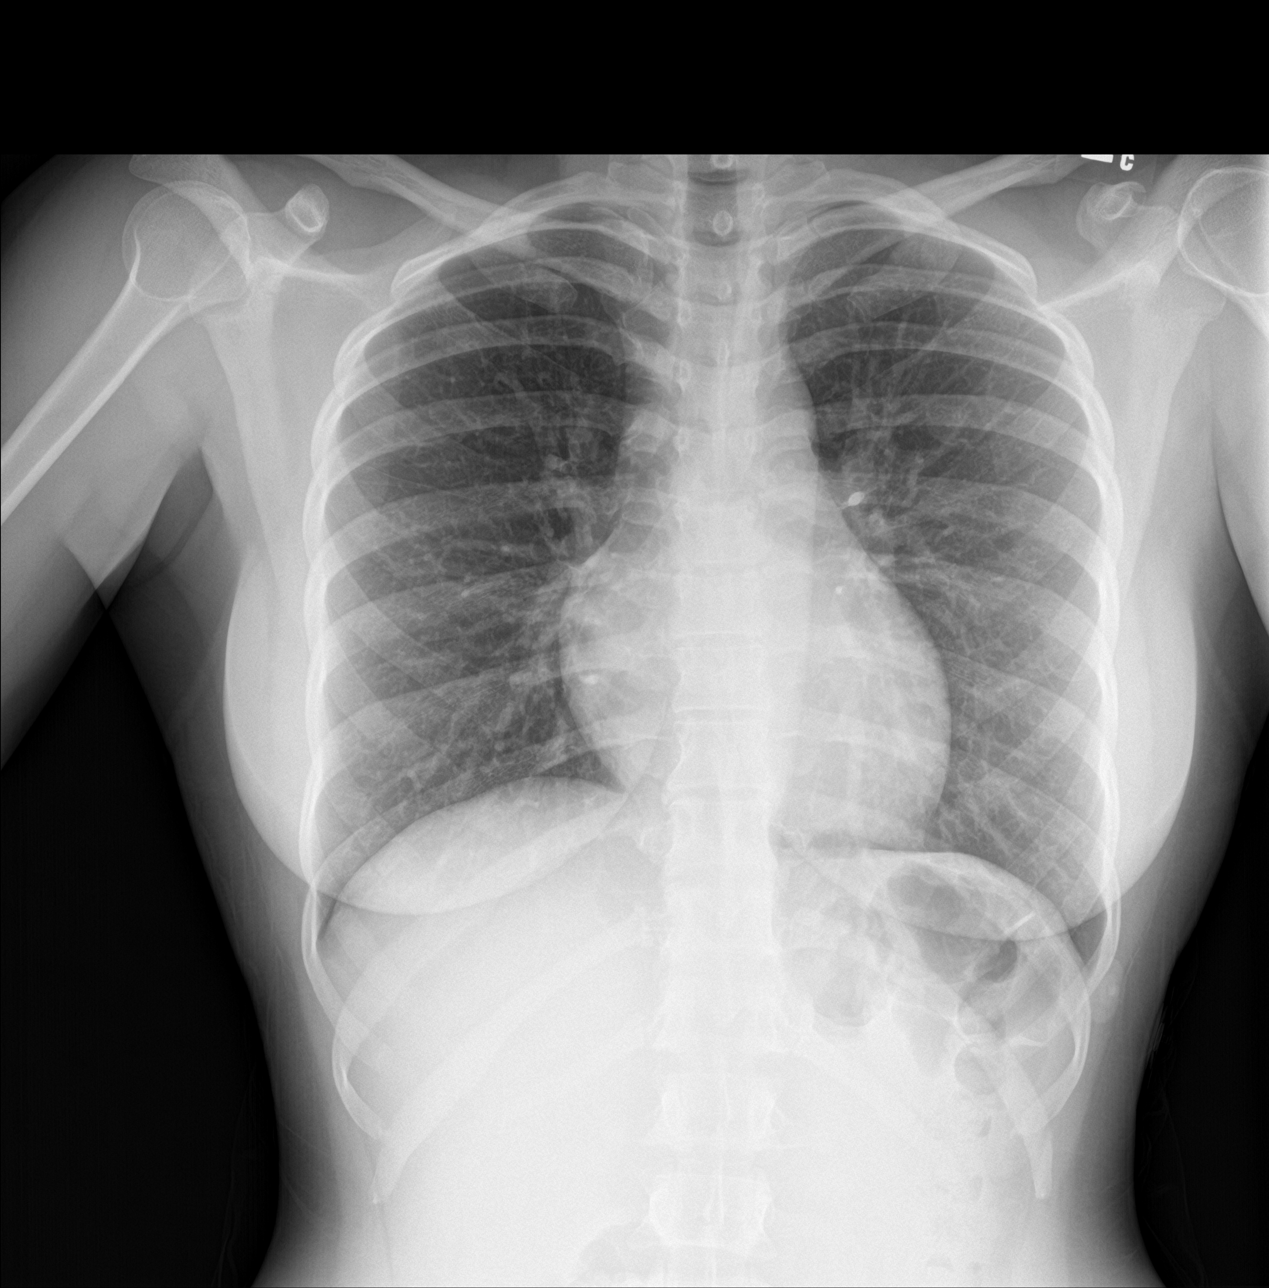

[3 of 3 positions shown; findings below may reference images not displayed]

FINDINGS: The heart size and mediastinal contours are within normal limits.
Both lungs are clear. The visualized skeletal structures are
unremarkable.
IMPRESSION: No active cardiopulmonary disease.

## 2020-07-25 DIAGNOSIS — E785 Hyperlipidemia, unspecified: Secondary | ICD-10-CM | POA: Insufficient documentation

## 2020-07-25 DIAGNOSIS — G43909 Migraine, unspecified, not intractable, without status migrainosus: Secondary | ICD-10-CM | POA: Insufficient documentation

## 2020-10-06 LAB — OB RESULTS CONSOLE RUBELLA ANTIBODY, IGM: Rubella: IMMUNE

## 2020-10-06 LAB — OB RESULTS CONSOLE ABO/RH: RH Type: POSITIVE

## 2020-10-06 LAB — OB RESULTS CONSOLE HEPATITIS B SURFACE ANTIGEN: Hepatitis B Surface Ag: NEGATIVE

## 2020-10-06 LAB — OB RESULTS CONSOLE HIV ANTIBODY (ROUTINE TESTING): HIV: NONREACTIVE

## 2020-10-06 LAB — HEPATITIS C ANTIBODY: HCV Ab: NEGATIVE

## 2020-10-06 LAB — OB RESULTS CONSOLE RPR: RPR: NONREACTIVE

## 2020-10-17 LAB — OB RESULTS CONSOLE GC/CHLAMYDIA
Chlamydia: NEGATIVE
Gonorrhea: NEGATIVE

## 2021-01-14 NOTE — L&D Delivery Note (Signed)
Delivery Note ? ?SVD viable female Apgars 9,9 over 2nd degree ML lac.  Placenta delivered spontaneously intact with 3VC. ?Repair with 2-0 Chromic with good support and hemostasis noted.  R/V exam confirms. .  ? ?Mother and baby to couplet care and are doing well. ? ?EBL 150cc ? ?Candice Camp, MD  ?

## 2021-03-31 ENCOUNTER — Inpatient Hospital Stay (HOSPITAL_COMMUNITY)
Admission: AD | Admit: 2021-03-31 | Discharge: 2021-03-31 | Disposition: A | Discharge status: Home / Self Care | Payer: Medicaid Other | Attending: Obstetrics and Gynecology | Admitting: Obstetrics and Gynecology

## 2021-03-31 ENCOUNTER — Encounter: Payer: Self-pay | Admitting: Student

## 2021-03-31 DIAGNOSIS — G43009 Migraine without aura, not intractable, without status migrainosus: Secondary | ICD-10-CM | POA: Insufficient documentation

## 2021-03-31 DIAGNOSIS — F809 Developmental disorder of speech and language, unspecified: Secondary | ICD-10-CM | POA: Insufficient documentation

## 2021-03-31 DIAGNOSIS — O99353 Diseases of the nervous system complicating pregnancy, third trimester: Secondary | ICD-10-CM | POA: Diagnosis present

## 2021-03-31 DIAGNOSIS — N926 Irregular menstruation, unspecified: Secondary | ICD-10-CM | POA: Insufficient documentation

## 2021-03-31 DIAGNOSIS — Z3A33 33 weeks gestation of pregnancy: Secondary | ICD-10-CM | POA: Insufficient documentation

## 2021-03-31 HISTORY — DX: Migraine, unspecified, not intractable, without status migrainosus: G43.909

## 2021-03-31 MED ORDER — METOCLOPRAMIDE HCL 10 MG PO TABS: 10.0000 mg | ORAL_TABLET | Freq: Three times a day (TID) | ORAL | 0 refills | Status: DC | PRN

## 2021-03-31 MED ORDER — CYCLOBENZAPRINE HCL 5 MG PO TABS
10.0000 mg | ORAL_TABLET | Freq: Once | ORAL | Status: AC
  Administered 2021-03-31: 10 mg via ORAL
  Filled 2021-03-31: qty 2

## 2021-03-31 MED ORDER — CYCLOBENZAPRINE HCL 10 MG PO TABS: 10.0000 mg | ORAL_TABLET | Freq: Three times a day (TID) | ORAL | 2 refills | Status: DC | PRN

## 2021-03-31 MED ORDER — ACETAMINOPHEN 500 MG PO TABS
1000.0000 mg | ORAL_TABLET | Freq: Once | ORAL | Status: AC
  Administered 2021-03-31: 1000 mg via ORAL
  Filled 2021-03-31: qty 2

## 2021-03-31 NOTE — MAU Provider Note (Signed)
?History  ?  ? ?[id number] ? ?Arrival date and time: 03/31/21 2022 ?  ? ?Chief Complaint  ?Patient presents with  ? Headache  ? Hypotension  ? ? ? ?HPI ?Erica Becker is a 31 y.o. at [redacted]w[redacted]d by ultrasound with PMHx notable for migraines, who presents for headache.  ?Symptoms started this morning.  Reports frontal headache that radiates to left side of face.  Worse with movement and lights.  Consistent with previous headaches. Rates 8/10.  Took 650 mg of Tylenol this morning without relief.  Denies visual change, vomiting, abdominal pain.  Reports good fetal movement. ? ?OB History   ? ? Gravida  ?3  ? Para  ?2  ? Term  ?2  ? Preterm  ?   ? AB  ?   ? Living  ?2  ?  ? ? SAB  ?   ? IAB  ?   ? Ectopic  ?   ? Multiple  ?0  ? Live Births  ?1  ?   ?  ?  ? ? ?Past Medical History:  ?Diagnosis Date  ? Migraines   ? Pregnancy induced hypertension   ? ? ?Past Surgical History:  ?Procedure Laterality Date  ? NO PAST SURGERIES    ? ? ?No family history on file. ? ?Allergies  ?Allergen Reactions  ? No Known Allergies   ? ? ?No current facility-administered medications on file prior to encounter.  ? ?Current Outpatient Medications on File Prior to Encounter  ?Medication Sig Dispense Refill  ? meclizine (ANTIVERT) 25 MG tablet meclizine 25 mg tablet ? TAKE 1 TABLET BY MOUTH 3 TIMES A DAY AS NEEDED FOR DIZZINESS FOR UP TO 10 DAYS    ? omeprazole (PRILOSEC) 20 MG capsule omeprazole 20 mg capsule,delayed release ? TAKE 1 CAPSULE BY MOUTH TWICE A DAY FOR 3 DAYS, THEN ONE CAPSULE BY MOUTH ONCE A DAY.    ? Prenatal Vit-Fe Phos-FA-Omega (VITAFOL GUMMIES) 3.33-0.333-34.8 MG CHEW SMARTSIG:3 gum By Mouth Daily    ? promethazine (PHENERGAN) 25 MG tablet promethazine 25 mg tablet ? TAKE 1 TABLET BY MOUTH EVERY 6 HOURS AS NEEDED FOR NAUSEA    ? SUMAtriptan (IMITREX) 50 MG tablet sumatriptan 50 mg tablet ? TAKE 1 TABLET BY MOUTH ONCE AS NEEDED FOR MIGRAINE FOR 1 DOSE. MAY REPEAT ONCE IN 2 HOURS    ? ? ? ?ROS ?Pertinent positives and negative per  HPI, all others reviewed and negative ? ?Physical Exam  ? ?BP 112/83   Pulse 70   Temp 98.1 ?F (36.7 ?C) (Oral)   Resp 17   Ht 5\' 5"  (1.651 m)   Wt 82.6 kg   SpO2 100%   BMI 30.30 kg/m?  ? ?Patient Vitals for the past 24 hrs: ? BP Temp Temp src Pulse Resp SpO2 Height Weight  ?03/31/21 2227 112/83 -- -- 70 -- -- -- --  ?03/31/21 2042 119/74 98.1 ?F (36.7 ?C) Oral 83 17 100 % 5\' 5"  (1.651 m) 82.6 kg  ? ? ?Physical Exam ?Vitals and nursing note reviewed.  ?Constitutional:   ?   General: She is not in acute distress. ?   Appearance: She is well-developed.  ?Eyes:  ?   General: No scleral icterus. ?   Pupils: Pupils are equal, round, and reactive to light.  ?Pulmonary:  ?   Effort: Pulmonary effort is normal. No respiratory distress.  ?Neurological:  ?   Mental Status: She is alert.  ?Psychiatric:     ?  Mood and Affect: Mood normal.     ?   Behavior: Behavior normal.  ?  ? ?FHT ?Baseline 150, moderate variability, 15x15 accels, no decels ?Toco: none ?Cat: 1 ? ?Labs ?No results found for this or any previous visit (from the past 24 hour(s)). ? ?Imaging ?No results found. ? ?MAU Course  ?Procedures ?Lab Orders  ?No laboratory test(s) ordered today  ? ?Meds ordered this encounter  ?Medications  ? acetaminophen (TYLENOL) tablet 1,000 mg  ? cyclobenzaprine (FLEXERIL) tablet 10 mg  ? metoCLOPramide (REGLAN) 10 MG tablet  ?  Sig: Take 1 tablet (10 mg total) by mouth every 8 (eight) hours as needed for nausea (or headache).  ?  Dispense:  30 tablet  ?  Refill:  0  ?  Order Specific Question:   Supervising Provider  ?  Answer:   Duane Lope H [[id number]]  ? cyclobenzaprine (FLEXERIL) 10 MG tablet  ?  Sig: Take 1 tablet (10 mg total) by mouth 3 (three) times daily as needed (headache).  ?  Dispense:  30 tablet  ?  Refill:  2  ?  Order Specific Question:   Supervising Provider  ?  Answer:   Duane Lope H [[id number]]  ? ?Imaging Orders  ?No imaging studies ordered today  ? ? ?MDM ?Patient presents with headache. Given tylenol,  flexeril, & reglan in MAU with good response. Patient ready for discharge & requesting prescription for flexeril & reglan.  ? ?Reviewed prenatal records from [facility] scanned under media. No complications with pregnancy so far.  ?Assessment and Plan  ? ?1. Migraine without aura and without status migrainosus, not intractable   ?2. [redacted] weeks gestation of pregnancy   ? ?-Rx flexeril & reglan prn to take with tylenol for headaches ?-reviewed reasons to return to MAU ?-keep f/u with OB ? ? ?Judeth Horn, NP ?04/01/21 ?12:35 AM ? ? ?

## 2021-03-31 NOTE — MAU Note (Signed)
Erica Becker is a 31 y.o. at [redacted]w[redacted]d here in MAU reporting: Pain on L side of head and eye. She also stated that she took her  ?BP at home and it was low ? ?Onset of complaint: this morning ?Pain score: 8 ?Vitals:  ? 03/31/21 2042  ?BP: 119/74  ?Pulse: 83  ?Resp: 17  ?Temp: 98.1 ?F (36.7 ?C)  ?SpO2: 100%  ?   ?FHT:147bpm ?Pt denies contractions or cramping. Endorses + fetal movement. Denies SROM, vaginal bleeding or discharge. ?

## 2021-03-31 NOTE — Discharge Instructions (Signed)
For prevention of migraines in pregnancy: -Magnesium, 400mg by mouth, once daily -Vitamin B2, 400mg by mouth, once daily  For treatment of migraines in pregnancy: -take medication at the first sign of the pain of a headache, or the first sign of your aura -start with 1000mg Tylenol (do not exceed 4000mg of Tylenol in 24hrs), with or without Reglan 10mg -if no relief after 1-2hours, can take Flexeril 10mg     

## 2021-04-24 LAB — OB RESULTS CONSOLE GBS: GBS: NEGATIVE

## 2021-05-09 ENCOUNTER — Telehealth (HOSPITAL_COMMUNITY): Payer: Self-pay | Admitting: *Deleted

## 2021-05-09 NOTE — Telephone Encounter (Signed)
Preadmission screen  

## 2021-05-10 ENCOUNTER — Encounter (HOSPITAL_COMMUNITY): Payer: Self-pay | Admitting: *Deleted

## 2021-05-14 ENCOUNTER — Inpatient Hospital Stay (HOSPITAL_COMMUNITY): Payer: Medicaid Other

## 2021-05-18 ENCOUNTER — Other Ambulatory Visit: Payer: Self-pay

## 2021-05-18 ENCOUNTER — Inpatient Hospital Stay (HOSPITAL_COMMUNITY)
Admission: AD | Admit: 2021-05-18 | Discharge: 2021-05-20 | DRG: 807 | Disposition: A | Discharge status: Home / Self Care | Payer: Medicaid Other | Attending: Obstetrics and Gynecology | Admitting: Obstetrics and Gynecology

## 2021-05-18 ENCOUNTER — Inpatient Hospital Stay (HOSPITAL_COMMUNITY): Payer: Medicaid Other | Admitting: Anesthesiology

## 2021-05-18 ENCOUNTER — Inpatient Hospital Stay (HOSPITAL_COMMUNITY): Payer: Medicaid Other

## 2021-05-18 ENCOUNTER — Encounter (HOSPITAL_COMMUNITY): Payer: Self-pay | Admitting: Obstetrics and Gynecology

## 2021-05-18 DIAGNOSIS — Z3A40 40 weeks gestation of pregnancy: Secondary | ICD-10-CM

## 2021-05-18 DIAGNOSIS — Z3490 Encounter for supervision of normal pregnancy, unspecified, unspecified trimester: Principal | ICD-10-CM

## 2021-05-18 DIAGNOSIS — O1214 Gestational proteinuria, complicating childbirth: Principal | ICD-10-CM | POA: Diagnosis present

## 2021-05-18 LAB — CBC
HCT: 33.2 % — ABNORMAL LOW (ref 36.0–46.0)
Hemoglobin: 11.2 g/dL — ABNORMAL LOW (ref 12.0–15.0)
MCH: 29.4 pg (ref 26.0–34.0)
MCHC: 33.7 g/dL (ref 30.0–36.0)
MCV: 87.1 fL (ref 80.0–100.0)
Platelets: 281 10*3/uL (ref 150–400)
RBC: 3.81 MIL/uL — ABNORMAL LOW (ref 3.87–5.11)
RDW: 13.2 % (ref 11.5–15.5)
WBC: 7.3 10*3/uL (ref 4.0–10.5)
nRBC: 0 % (ref 0.0–0.2)

## 2021-05-18 LAB — TYPE AND SCREEN
ABO/RH(D): A POS
Antibody Screen: NEGATIVE

## 2021-05-18 LAB — RPR: RPR Ser Ql: NONREACTIVE

## 2021-05-18 MED ORDER — EPHEDRINE 5 MG/ML INJ: 10.0000 mg | INTRAVENOUS | Status: DC | PRN

## 2021-05-18 MED ORDER — LIDOCAINE HCL (PF) 1 % IJ SOLN
INTRAMUSCULAR | Status: DC | PRN
  Administered 2021-05-18 (×2): 5 mL via EPIDURAL

## 2021-05-18 MED ORDER — SENNOSIDES-DOCUSATE SODIUM 8.6-50 MG PO TABS
2.0000 | ORAL_TABLET | Freq: Every day | ORAL | Status: DC
  Administered 2021-05-19 – 2021-05-20 (×2): 2 via ORAL
  Filled 2021-05-18 (×2): qty 2

## 2021-05-18 MED ORDER — FENTANYL-BUPIVACAINE-NACL 0.5-0.125-0.9 MG/250ML-% EP SOLN
12.0000 mL/h | EPIDURAL | Status: DC | PRN
  Administered 2021-05-18: 12 mL/h via EPIDURAL
  Filled 2021-05-18: qty 250

## 2021-05-18 MED ORDER — SIMETHICONE 80 MG PO CHEW: 80.0000 mg | CHEWABLE_TABLET | ORAL | Status: DC | PRN

## 2021-05-18 MED ORDER — BENZOCAINE-MENTHOL 20-0.5 % EX AERO: 1.0000 "application " | INHALATION_SPRAY | CUTANEOUS | Status: DC | PRN

## 2021-05-18 MED ORDER — FENTANYL-BUPIVACAINE-NACL 0.5-0.125-0.9 MG/250ML-% EP SOLN: 12.0000 mL/h | EPIDURAL | Status: DC | PRN

## 2021-05-18 MED ORDER — ONDANSETRON HCL 4 MG PO TABS: 4.0000 mg | ORAL_TABLET | ORAL | Status: DC | PRN

## 2021-05-18 MED ORDER — DIPHENHYDRAMINE HCL 25 MG PO CAPS: 25.0000 mg | ORAL_CAPSULE | Freq: Four times a day (QID) | ORAL | Status: DC | PRN

## 2021-05-18 MED ORDER — LACTATED RINGERS IV SOLN: 500.0000 mL | Freq: Once | INTRAVENOUS | Status: DC

## 2021-05-18 MED ORDER — PHENYLEPHRINE 80 MCG/ML (10ML) SYRINGE FOR IV PUSH (FOR BLOOD PRESSURE SUPPORT): 80.0000 ug | PREFILLED_SYRINGE | INTRAVENOUS | Status: DC | PRN

## 2021-05-18 MED ORDER — OXYCODONE-ACETAMINOPHEN 5-325 MG PO TABS: 1.0000 | ORAL_TABLET | ORAL | Status: DC | PRN

## 2021-05-18 MED ORDER — ACETAMINOPHEN 325 MG PO TABS: 650.0000 mg | ORAL_TABLET | ORAL | Status: DC | PRN

## 2021-05-18 MED ORDER — DIPHENHYDRAMINE HCL 50 MG/ML IJ SOLN: 12.5000 mg | INTRAMUSCULAR | Status: DC | PRN

## 2021-05-18 MED ORDER — PRENATAL MULTIVITAMIN CH
1.0000 | ORAL_TABLET | Freq: Every day | ORAL | Status: DC
  Administered 2021-05-19 – 2021-05-20 (×2): 1 via ORAL
  Filled 2021-05-18 (×2): qty 1

## 2021-05-18 MED ORDER — OXYCODONE-ACETAMINOPHEN 5-325 MG PO TABS: 2.0000 | ORAL_TABLET | ORAL | Status: DC | PRN

## 2021-05-18 MED ORDER — OXYTOCIN-SODIUM CHLORIDE 30-0.9 UT/500ML-% IV SOLN
2.5000 [IU]/h | INTRAVENOUS | Status: DC
  Administered 2021-05-18: 2.5 [IU]/h via INTRAVENOUS
  Filled 2021-05-18: qty 500

## 2021-05-18 MED ORDER — LIDOCAINE HCL (PF) 1 % IJ SOLN: 30.0000 mL | INTRAMUSCULAR | Status: DC | PRN

## 2021-05-18 MED ORDER — ONDANSETRON HCL 4 MG/2ML IJ SOLN: 4.0000 mg | INTRAMUSCULAR | Status: DC | PRN

## 2021-05-18 MED ORDER — MISOPROSTOL 50MCG HALF TABLET
50.0000 ug | ORAL_TABLET | ORAL | Status: AC
  Administered 2021-05-18 (×2): 50 ug via ORAL
  Filled 2021-05-18 (×2): qty 1

## 2021-05-18 MED ORDER — IBUPROFEN 600 MG PO TABS
600.0000 mg | ORAL_TABLET | Freq: Four times a day (QID) | ORAL | Status: DC
  Administered 2021-05-18 – 2021-05-20 (×7): 600 mg via ORAL
  Filled 2021-05-18 (×7): qty 1

## 2021-05-18 MED ORDER — WITCH HAZEL-GLYCERIN EX PADS: 1.0000 "application " | MEDICATED_PAD | CUTANEOUS | Status: DC | PRN

## 2021-05-18 MED ORDER — PHENYLEPHRINE 80 MCG/ML (10ML) SYRINGE FOR IV PUSH (FOR BLOOD PRESSURE SUPPORT)
80.0000 ug | PREFILLED_SYRINGE | INTRAVENOUS | Status: DC | PRN
  Filled 2021-05-18: qty 10

## 2021-05-18 MED ORDER — ZOLPIDEM TARTRATE 5 MG PO TABS: 5.0000 mg | ORAL_TABLET | Freq: Every evening | ORAL | Status: DC | PRN

## 2021-05-18 MED ORDER — SOD CITRATE-CITRIC ACID 500-334 MG/5ML PO SOLN
30.0000 mL | ORAL | Status: DC | PRN
  Administered 2021-05-18: 30 mL via ORAL
  Filled 2021-05-18: qty 30

## 2021-05-18 MED ORDER — TETANUS-DIPHTH-ACELL PERTUSSIS 5-2.5-18.5 LF-MCG/0.5 IM SUSY: 0.5000 mL | PREFILLED_SYRINGE | Freq: Once | INTRAMUSCULAR | Status: DC

## 2021-05-18 MED ORDER — ONDANSETRON HCL 4 MG/2ML IJ SOLN: 4.0000 mg | Freq: Four times a day (QID) | INTRAMUSCULAR | Status: DC | PRN

## 2021-05-18 MED ORDER — COCONUT OIL OIL: 1.0000 "application " | TOPICAL_OIL | Status: DC | PRN

## 2021-05-18 MED ORDER — DIBUCAINE (PERIANAL) 1 % EX OINT
1.0000 | TOPICAL_OINTMENT | CUTANEOUS | Status: DC | PRN
Start: 2021-05-18 — End: 2021-05-20

## 2021-05-18 MED ORDER — LACTATED RINGERS IV SOLN: 500.0000 mL | INTRAVENOUS | Status: DC | PRN

## 2021-05-18 MED ORDER — LACTATED RINGERS IV SOLN: INTRAVENOUS | Status: DC

## 2021-05-18 MED ORDER — MEASLES, MUMPS & RUBELLA VAC IJ SOLR: 0.5000 mL | Freq: Once | INTRAMUSCULAR | Status: DC

## 2021-05-18 MED ORDER — MEDROXYPROGESTERONE ACETATE 150 MG/ML IM SUSP: 150.0000 mg | INTRAMUSCULAR | Status: DC | PRN

## 2021-05-18 MED ORDER — BUTORPHANOL TARTRATE 1 MG/ML IJ SOLN: 1.0000 mg | INTRAMUSCULAR | Status: DC | PRN

## 2021-05-18 MED ORDER — TERBUTALINE SULFATE 1 MG/ML IJ SOLN: 0.2500 mg | Freq: Once | INTRAMUSCULAR | Status: DC | PRN

## 2021-05-18 MED ORDER — OXYTOCIN BOLUS FROM INFUSION
333.0000 mL | Freq: Once | INTRAVENOUS | Status: AC
  Administered 2021-05-18: 333 mL via INTRAVENOUS

## 2021-05-18 NOTE — Lactation Note (Signed)
This note was copied from a baby's chart. ?Lactation Consultation Note ? ?Patient Name: Erica Becker ?Today's Date: 05/18/2021 ?Reason for consult: L&D Initial assessment;Term ?Age:33 hours ? ? ?Initial L&D Consult: ? ?Visited with family > 1 hour after birth ?Baby under the radiant warmer; permission obtained from the RN to latch as mother desires.  Offered to assist with latching, however, mother politely declined at this time.  She would like to have a lactation visit later this evening.  Father present.  Allowed time for family bonding. ? ? ?Maternal Data ?  ? ?Feeding ?Mother's Current Feeding Choice: Breast Milk and Formula ? ?LATCH Score ?  ? ?  ? ?  ? ?  ? ?  ? ?  ? ? ?Lactation Tools Discussed/Used ?  ? ?Interventions ?  ? ?Discharge ?  ? ?Consult Status ?Consult Status: Follow-up from L&D ? ? ? ?Adelae Yodice R Akshay Spang ?05/18/2021, 6:14 PM ? ? ? ?

## 2021-05-18 NOTE — Anesthesia Procedure Notes (Signed)
Epidural ?Patient location during procedure: OB ?Start time: 05/18/2021 9:02 AM ?End time: 05/18/2021 9:12 AM ? ?Staffing ?Anesthesiologist: Josephine Igo, MD ? ?Preanesthetic Checklist ?Completed: patient identified, IV checked, site marked, risks and benefits discussed, surgical consent, monitors and equipment checked, pre-op evaluation and timeout performed ? ?Epidural ?Patient position: sitting ?Prep: DuraPrep and site prepped and draped ?Patient monitoring: continuous pulse ox and blood pressure ?Approach: midline ?Location: L3-L4 ?Injection technique: LOR air ? ?Needle:  ?Needle type: Tuohy  ?Needle gauge: 17 G ?Needle length: 9 cm and 9 ?Needle insertion depth: 6 cm ?Catheter type: closed end flexible ?Catheter size: 19 Gauge ?Catheter at skin depth: 11 cm ?Test dose: negative and Other ? ?Assessment ?Events: blood not aspirated, injection not painful, no injection resistance, no paresthesia and negative IV test ? ?Additional Notes ?Patient tolerated procedure well. Adequate sensory level. ? ?Reason for block:procedure for pain ? ? ? ?

## 2021-05-18 NOTE — Anesthesia Preprocedure Evaluation (Signed)
Anesthesia Evaluation  ?Patient identified by MRN, date of birth, ID band ?Patient awake ? ? ? ?Reviewed: ?Allergy & Precautions, Patient's Chart, lab work & pertinent test results ? ?Airway ?Mallampati: I ? ?TM Distance: >3 FB ?Neck ROM: Full ? ? ? Dental ?no notable dental hx. ?(+) Teeth Intact, Dental Advisory Given ?  ?Pulmonary ?neg pulmonary ROS,  ?  ?Pulmonary exam normal ?breath sounds clear to auscultation ? ? ? ? ? ? Cardiovascular ?hypertension, Normal cardiovascular exam ?Rhythm:Regular Rate:Normal ? ?PIH on no Rx ?  ?Neuro/Psych ? Headaches, negative psych ROS  ? GI/Hepatic ?Neg liver ROS, GERD  Medicated,  ?Endo/Other  ?Obesity ? Renal/GU ?negative Renal ROS  ?negative genitourinary ?  ?Musculoskeletal ?negative musculoskeletal ROS ?(+)  ? Abdominal ?(+) + obese,   ?Peds ? Hematology ? ?(+) Blood dyscrasia, anemia ,   ?Anesthesia Other Findings ? ? Reproductive/Obstetrics ?(+) Pregnancy ? ?  ? ? ? ? ? ? ? ? ? ? ? ? ? ?  ?  ? ? ? ? ? ? ? ? ?Anesthesia Physical ?Anesthesia Plan ? ?ASA: 2 ? ?Anesthesia Plan: Epidural  ? ?Post-op Pain Management:   ? ?Induction:  ? ?PONV Risk Score and Plan:  ? ?Airway Management Planned: Natural Airway ? ?Additional Equipment:  ? ?Intra-op Plan:  ? ?Post-operative Plan:  ? ?Informed Consent: I have reviewed the patients History and Physical, chart, labs and discussed the procedure including the risks, benefits and alternatives for the proposed anesthesia with the patient or authorized representative who has indicated his/her understanding and acceptance.  ? ? ? ? ? ?Plan Discussed with: Anesthesiologist ? ?Anesthesia Plan Comments:   ? ? ? ? ? ? ?Anesthesia Quick Evaluation ? ?

## 2021-05-18 NOTE — Lactation Note (Signed)
This note was copied from a baby's chart. ?Lactation Consultation Note ?Experienced BF mom stated BF going well.  ?Mom spoke good [place] w/LC. Mom denied need for Interpreter. Mom  stated if she didn't understand me FOB would help her. Mom understood and communicated well w/LC. ?Mom is BF/formula. Encouraged to BF before giving formula. Newborn feeding habits, STS, I&O reviewed. ?Encouraged to call for assistance if needed. Mom stated she was good right now and didn't need anything. ?Mom will call if needs anything. ? ?Patient Name: Erica Becker ?Today's Date: 05/18/2021 ?Reason for consult: Initial assessment;Term ?Age:62 hours ? ?Maternal Data ?Has patient been taught Hand Expression?: Yes ?Does the patient have breastfeeding experience prior to this delivery?: Yes ?How long did the patient breastfeed?: 1 yr to 1st child 6 months 2nd child ? ?Feeding ?Mother's Current Feeding Choice: Breast Milk and Formula ? ?LATCH Score ?Latch: Grasps breast easily, tongue down, lips flanged, rhythmical sucking. ? ?Audible Swallowing: A few with stimulation ? ?Type of Nipple: Everted at rest and after stimulation ? ?Comfort (Breast/Nipple): Soft / non-tender ? ?Hold (Positioning): No assistance needed to correctly position infant at breast. ? ?LATCH Score: 8 ? ? ?Lactation Tools Discussed/Used ?  ? ?Interventions ?Interventions: Breast feeding basics reviewed;LC Services brochure ? ?Discharge ?  ? ?Consult Status ?Consult Status: PRN ?Date: 05/19/21 ?Follow-up type: In-patient ? ? ? ?Charyl Dancer ?05/18/2021, 10:05 PM ? ? ? ?

## 2021-05-18 NOTE — H&P (Signed)
Erica Becker is a 31 y.o. female presenting for IOL at her due date.  Pregnancy complicated by proteinuria since the beginning of the pregnancy. Seen by nephrology ?No PIH sxs.  GBS nege. ?OB History   ? ? Gravida  ?3  ? Para  ?2  ? Term  ?2  ? Preterm  ?   ? AB  ?   ? Living  ?2  ?  ? ? SAB  ?   ? IAB  ?   ? Ectopic  ?   ? Multiple  ?0  ? Live Births  ?2  ?   ?  ?  ? ?Past Medical History:  ?Diagnosis Date  ? Migraines   ? Pregnancy induced hypertension   ? ?Past Surgical History:  ?Procedure Laterality Date  ? NO PAST SURGERIES    ? ?Family History: family history includes Hypertension in her mother. ?Social History:  reports that she has never smoked. She has never used smokeless tobacco. She reports that she does not drink alcohol and does not use drugs. ? ? ?  ?Maternal Diabetes: No ?Genetic Screening: Normal ?Maternal Ultrasounds/Referrals: Normal ?Fetal Ultrasounds or other Referrals:  None ?Maternal Substance Abuse:  No ?Significant Maternal Medications:  None ?Significant Maternal Lab Results:  Group B Strep negative ?Other Comments:  None ? ?Review of Systems ?History ?Dilation: 3.5 ?Effacement (%): 50 ?Station: -3 ?Exam by:: Dr. Rana Snare ?Blood pressure 118/77, pulse 65, temperature 98 ?F (36.7 ?C), temperature source Oral, resp. rate 18, height 5\' 6"  (1.676 m), weight 84.9 kg, SpO2 99 %, unknown if currently breastfeeding. ?Exam ?Physical Exam  ?Vitals and nursing note reviewed. Exam conducted with a chaperone present.  ?Constitutional:   ?   Appearance: Normal appearance.  ?HENT:  ?   Head: Normocephalic.  ?Eyes:  ?   Pupils: Pupils are equal, round, and reactive to light.  ?Cardiovascular:  ?   Rate and Rhythm: Normal rate and regular rhythm.  ?   Pulses: Normal pulses.  ?Abdominal:  ?   General: Abdomen is Gravid, nontender ?Neurological:  ?   Mental Status: She is alert.  ?Prenatal labs: ?ABO, Rh: --/--/A POS (05/05 02-02-2001) ?Antibody: NEG (05/05 0039) ?Rubella: Immune (09/23 0000) ?RPR: NON REACTIVE  (05/05 0039)  ?HBsAg: Negative (09/23 0000)  ?HIV: Non-reactive (09/23 0000)  ?GBS: Negative/-- (04/11 0000)  ? ?Assessment/Plan: ?IUP at 40 weeks ?Proteinuria without sxs of PIH  ?IOL, s/p cytotec, now with AROM ?Will use pit prn ?Anticipate SVD ? ?03-12-1974 ?05/18/2021, 10:59 AM ? ? ? ? ?

## 2021-05-19 LAB — CBC
HCT: 29.9 % — ABNORMAL LOW (ref 36.0–46.0)
Hemoglobin: 10.3 g/dL — ABNORMAL LOW (ref 12.0–15.0)
MCH: 29.9 pg (ref 26.0–34.0)
MCHC: 34.4 g/dL (ref 30.0–36.0)
MCV: 86.9 fL (ref 80.0–100.0)
Platelets: 224 10*3/uL (ref 150–400)
RBC: 3.44 MIL/uL — ABNORMAL LOW (ref 3.87–5.11)
RDW: 13.2 % (ref 11.5–15.5)
WBC: 8.7 10*3/uL (ref 4.0–10.5)
nRBC: 0 % (ref 0.0–0.2)

## 2021-05-19 NOTE — Anesthesia Postprocedure Evaluation (Signed)
Anesthesia Post Note ? ?Patient: Nolyn Eilert ? ?Procedure(s) Performed: AN AD HOC LABOR EPIDURAL ? ?  ? ?Patient location during evaluation: Mother Baby ?Anesthesia Type: Epidural ?Level of consciousness: awake and alert ?Pain management: pain level controlled ?Vital Signs Assessment: post-procedure vital signs reviewed and stable ?Respiratory status: spontaneous breathing, nonlabored ventilation and respiratory function stable ?Cardiovascular status: stable ?Postop Assessment: no headache, no backache and epidural receding ?Anesthetic complications: no ? ? ?No notable events documented. ? ?Last Vitals:  ?Vitals:  ? 05/18/21 2353 05/19/21 0346  ?BP: 121/82 123/84  ?Pulse: 68 63  ?Resp: 19 19  ?Temp: 36.5 ?C   ?SpO2: 100% 100%  ?  ?Last Pain:  ?Vitals:  ? 05/18/21 2353  ?TempSrc: Oral  ?PainSc:   ? ?Pain Goal:   ? ?  ?  ?  ?  ?  ?  ?  ? ?Elissa Grieshop ? ? ? ? ?

## 2021-05-19 NOTE — Progress Notes (Signed)
Post Partum Day  1 ?Subjective: ?no complaints, up ad lib, voiding, and tolerating PO ? ?Objective: ?Blood pressure 117/73, pulse 60, temperature 98.1 ?F (36.7 ?C), temperature source Oral, resp. rate 18, height 5\' 6"  (1.676 m), weight 84.9 kg, SpO2 99 %, unknown if currently breastfeeding. ? ?Physical Exam:  ?General: alert, cooperative, appears stated age, and no distress ?Lochia: appropriate ?Uterine Fundus: firm ?Incision:   ?DVT Evaluation: No evidence of DVT seen on physical exam. ? ?Recent Labs  ?  05/18/21 ?07/18/21 05/19/21 ?0419  ?HGB 11.2* 10.3*  ?HCT 33.2* 29.9*  ? ? ?Assessment/Plan: ?Plan for discharge tomorrow and Circumcision prior to discharge ? ? LOS: 1 day  ? ?07/19/21 ?05/19/2021, 9:53 AM  ? ? ?

## 2021-05-20 MED ORDER — IBUPROFEN 600 MG PO TABS: 600.0000 mg | ORAL_TABLET | Freq: Four times a day (QID) | ORAL | 0 refills | Status: DC | PRN

## 2021-05-20 NOTE — Progress Notes (Signed)
RN notified CSW that parents needed a car seat as the one they ordered did not arrive in time and they have no other means of getting a car seat. RN reported that parents are able to pay $30 for car seat.  ? ?CSW met with parents at bedside and delivered car seat. MOB signed form and provided $40 as they didn't have change. CSW inquired about any additional needs/concerns, parents reported none.  ? ?CSW will provide signed form and money to volunteer guest services.  ? ?Abundio Miu, LCSW ?Clinical Social Worker ?[facility] ?Cell#: [phone] ? ?

## 2021-05-20 NOTE — Discharge Summary (Signed)
? ?  Postpartum Discharge Summary ? ? ? ?   ?Patient Name: Erica Becker ?DOB: 22-Dec-1990 ?MRN: [id number] ? ?Date of admission: 05/18/2021 ?Delivery date:05/18/2021  ?Delivering provider: Karmon Andis  ?Date of discharge: 05/20/2021 ? ?Admitting diagnosis: Encounter for induction of labor [Z34.90] ?Intrauterine pregnancy: [redacted]w[redacted]d     ?Secondary diagnosis:  Principal Problem: ?  Encounter for induction of labor ? ?Additional problems:      ?Discharge diagnosis: Term Pregnancy Delivered                                              ?Post partum procedures:   ?Augmentation: AROM, Pitocin, and Cytotec ?Complications: None ? ?Hospital course: Induction of Labor With Vaginal Delivery   ?31 y.o. yo G3P3003 at [redacted]w[redacted]d was admitted to the hospital 05/18/2021 for induction of labor.  Indication for induction: Elective.  Patient had an uncomplicated labor course as follows: ?Membrane Rupture Time/Date: 10:49 AM ,05/18/2021   ?Delivery Method:Vaginal, Spontaneous  ?Episiotomy: None  ?Lacerations:  2nd degree  ?Details of delivery can be found in separate delivery note.  Patient had a routine postpartum course. Patient is discharged home 05/20/21. ? ?Newborn Data: ?Birth date:05/18/2021  ?Birth time:4:45 PM  ?Gender:Female  ?Living status:Living  ?Apgars:9 ,9  ?Weight:3090 g  ? ?Magnesium Sulfate received: No ?BMZ received: No ?Rhophylac:N/A ?MMR:N/A ?T-DaP:Given prenatally ?Flu: N/A ?Transfusion:No ? ?Physical exam  ?Vitals:  ? 05/19/21 0346 05/19/21 0800 05/19/21 2200 05/20/21 0527  ?BP: 123/84 117/73 120/87 120/72  ?Pulse: 63 60  63  ?Resp: $Remov'19 18 18 18  'GGXZQh$ ?Temp:  98.1 ?F (36.7 ?C) 99.1 ?F (37.3 ?C) (!) 97.5 ?F (36.4 ?C)  ?TempSrc:  Oral Oral Axillary  ?SpO2: 100% 99% 100% 99%  ?Weight:      ?Height:      ? ?General: alert, cooperative, and no distress ?Lochia: appropriate ?Uterine Fundus: firm ?Incision: N/A ?DVT Evaluation: No evidence of DVT seen on physical exam. ?Labs: ?Lab Results  ?Component Value Date  ? WBC 8.7 05/19/2021  ? HGB 10.3 (L)  05/19/2021  ? HCT 29.9 (L) 05/19/2021  ? MCV 86.9 05/19/2021  ? PLT 224 05/19/2021  ? ? ?  Latest Ref Rng & Units 02/18/2018  ?  1:24 AM  ?CMP  ?Glucose 70 - 99 mg/dL 90    ?BUN 6 - 20 mg/dL 15    ?Creatinine 0.44 - 1.00 mg/dL 0.64    ?Sodium 135 - 145 mmol/L 133    ?Potassium 3.5 - 5.1 mmol/L 3.7    ?Chloride 98 - 111 mmol/L 107    ?CO2 22 - 32 mmol/L 22    ?Calcium 8.9 - 10.3 mg/dL 8.0    ? ?Edinburgh Score: ?   ? View : No data to display.  ?  ?  ?  ? ? ? ? ?After visit meds:  ?Allergies as of 05/20/2021   ? ?   Reactions  ? No Known Allergies   ? ?  ? ?  ?Medication List  ?  ? ?TAKE these medications   ? ?cyclobenzaprine 10 MG tablet ?Commonly known as: FLEXERIL ?Take 1 tablet (10 mg total) by mouth 3 (three) times daily as needed (headache). ?  ?ibuprofen 600 MG tablet ?Commonly known as: ADVIL ?Take 1 tablet (600 mg total) by mouth every 6 (six) hours as needed for mild pain, moderate pain or cramping. ?  ?meclizine 25  MG tablet ?Commonly known as: ANTIVERT ?meclizine 25 mg tablet ? TAKE 1 TABLET BY MOUTH 3 TIMES A DAY AS NEEDED FOR DIZZINESS FOR UP TO 10 DAYS ?  ?metoCLOPramide 10 MG tablet ?Commonly known as: REGLAN ?Take 1 tablet (10 mg total) by mouth every 8 (eight) hours as needed for nausea (or headache). ?  ?omeprazole 20 MG capsule ?Commonly known as: PRILOSEC ?omeprazole 20 mg capsule,delayed release ? TAKE 1 CAPSULE BY MOUTH TWICE A DAY FOR 3 DAYS, THEN ONE CAPSULE BY MOUTH ONCE A DAY. ?  ?promethazine 25 MG tablet ?Commonly known as: PHENERGAN ?promethazine 25 mg tablet ? TAKE 1 TABLET BY MOUTH EVERY 6 HOURS AS NEEDED FOR NAUSEA ?  ?SUMAtriptan 50 MG tablet ?Commonly known as: IMITREX ?sumatriptan 50 mg tablet ? TAKE 1 TABLET BY MOUTH ONCE AS NEEDED FOR MIGRAINE FOR 1 DOSE. MAY REPEAT ONCE IN 2 HOURS ?  ?Vitafol Gummies 3.33-0.333-34.8 MG Chew ?SMARTSIG:3 gum By Mouth Daily ?  ? ?  ? ? ? ?Discharge home in stable condition ?Infant Feeding: Breast ?Infant Disposition:home with mother ?Discharge  instruction: per After Visit Summary and Postpartum booklet. ?Activity: Advance as tolerated. Pelvic rest for 6 weeks.  ?Diet: routine diet ?Anticipated Birth Control: Unsure ?Postpartum Appointment:6 weeks ?Additional Postpartum F/U:    ?Future Appointments:No future appointments. ?Follow up Visit: ? ? ?  ? ?05/20/2021 ?Luz Lex, MD ? ? ?

## 2021-05-26 ENCOUNTER — Telehealth (HOSPITAL_COMMUNITY): Payer: Self-pay

## 2021-05-26 NOTE — Telephone Encounter (Signed)
No answer on both phone numbers listed for patient. No voicemail option.  ? Erica Becker ?05/13//2023,1446 ?

## 2021-08-07 ENCOUNTER — Other Ambulatory Visit: Payer: Self-pay | Admitting: Nephrology

## 2021-08-07 ENCOUNTER — Other Ambulatory Visit (HOSPITAL_COMMUNITY): Payer: Self-pay | Admitting: Nephrology

## 2021-08-07 DIAGNOSIS — R809 Proteinuria, unspecified: Secondary | ICD-10-CM

## 2021-08-16 ENCOUNTER — Other Ambulatory Visit: Payer: Self-pay | Admitting: Radiology

## 2021-08-16 DIAGNOSIS — R809 Proteinuria, unspecified: Secondary | ICD-10-CM

## 2021-08-20 ENCOUNTER — Ambulatory Visit (HOSPITAL_COMMUNITY)
Admission: RE | Admit: 2021-08-20 | Discharge: 2021-08-20 | Disposition: A | Discharge status: Home / Self Care | Payer: Medicaid Other | Source: Ambulatory Visit | Attending: Nephrology | Admitting: Nephrology

## 2021-08-20 ENCOUNTER — Encounter (HOSPITAL_COMMUNITY): Payer: Self-pay

## 2021-08-20 DIAGNOSIS — R809 Proteinuria, unspecified: Secondary | ICD-10-CM

## 2021-08-29 ENCOUNTER — Other Ambulatory Visit: Payer: Self-pay | Admitting: Internal Medicine

## 2021-08-29 ENCOUNTER — Other Ambulatory Visit: Payer: Self-pay | Admitting: Radiology

## 2021-08-30 ENCOUNTER — Ambulatory Visit (HOSPITAL_COMMUNITY)
Admission: RE | Admit: 2021-08-30 | Discharge: 2021-08-30 | Disposition: A | Discharge status: Home / Self Care | Payer: BC Managed Care – PPO | Source: Ambulatory Visit | Attending: Nephrology | Admitting: Nephrology

## 2021-08-30 ENCOUNTER — Other Ambulatory Visit: Payer: Self-pay

## 2021-08-30 ENCOUNTER — Encounter (HOSPITAL_COMMUNITY): Payer: Self-pay

## 2021-08-30 DIAGNOSIS — R809 Proteinuria, unspecified: Secondary | ICD-10-CM

## 2021-08-30 LAB — CBC
HCT: 38.7 % (ref 36.0–46.0)
Hemoglobin: 13.1 g/dL (ref 12.0–15.0)
MCH: 29.6 pg (ref 26.0–34.0)
MCHC: 33.9 g/dL (ref 30.0–36.0)
MCV: 87.4 fL (ref 80.0–100.0)
Platelets: 333 10*3/uL (ref 150–400)
RBC: 4.43 MIL/uL (ref 3.87–5.11)
RDW: 12.6 % (ref 11.5–15.5)
WBC: 6.8 10*3/uL (ref 4.0–10.5)
nRBC: 0 % (ref 0.0–0.2)

## 2021-08-30 LAB — PROTIME-INR
INR: 0.9 (ref 0.8–1.2)
Prothrombin Time: 12 seconds (ref 11.4–15.2)

## 2021-08-30 MED ORDER — FENTANYL CITRATE (PF) 100 MCG/2ML IJ SOLN
INTRAMUSCULAR | Status: AC
  Filled 2021-08-30: qty 2

## 2021-08-30 MED ORDER — SODIUM CHLORIDE 0.9 % IV SOLN
INTRAVENOUS | Status: DC
  Administered 2021-08-30: 10 mL/h via INTRAVENOUS

## 2021-08-30 MED ORDER — MIDAZOLAM HCL 2 MG/2ML IJ SOLN
INTRAMUSCULAR | Status: AC | PRN
  Administered 2021-08-30: .5 mg via INTRAVENOUS
  Administered 2021-08-30: 1 mg via INTRAVENOUS
  Administered 2021-08-30: .5 mg via INTRAVENOUS

## 2021-08-30 MED ORDER — MIDAZOLAM HCL 2 MG/2ML IJ SOLN
INTRAMUSCULAR | Status: AC
  Filled 2021-08-30: qty 2

## 2021-08-30 MED ORDER — HYDROCODONE-ACETAMINOPHEN 5-325 MG PO TABS: 1.0000 | ORAL_TABLET | ORAL | Status: DC | PRN

## 2021-08-30 MED ORDER — LIDOCAINE HCL (PF) 1 % IJ SOLN
INTRAMUSCULAR | Status: AC
  Filled 2021-08-30: qty 30

## 2021-08-30 MED ORDER — FENTANYL CITRATE (PF) 100 MCG/2ML IJ SOLN
INTRAMUSCULAR | Status: AC | PRN
  Administered 2021-08-30: 50 ug via INTRAVENOUS
  Administered 2021-08-30 (×2): 25 ug via INTRAVENOUS

## 2021-08-30 MED ORDER — GELATIN ABSORBABLE 12-7 MM EX MISC
CUTANEOUS | Status: AC
  Filled 2021-08-30: qty 1

## 2021-08-30 NOTE — Procedures (Signed)
Interventional Radiology Procedure Note ? ?Procedure: US guided random renal biopsy ? ?Indication: Proteinuria ? ?Findings: Please refer to procedural dictation for full description. ? ?Complications: None ? ?EBL: < 10 mL ? ?Timithy Arons, MD ?[phone] ? ? ?

## 2021-08-30 NOTE — H&P (Signed)
Chief Complaint: Patient was seen in consultation today for random renal biopsy at the request of Maxie Barb  Referring Physician(s): Maxie Barb  Supervising Physician: Mir, Mauri Reading  Patient Status: [facility] - Out-pt  History of Present Illness: Erica Becker is a 31 y.o. female   Known mild proteinuria since 2015 and 2017 in OB office during previous pregnancies Recent delivery of infant May 2023 Proteinuria persistent and now increased Dr Ronalee Belts requesting random renal biopsy   Past Medical History:  Diagnosis Date   Migraines    Pregnancy induced hypertension     Past Surgical History:  Procedure Laterality Date   NO PAST SURGERIES      Allergies: Patient has no known allergies.  Medications: Prior to Admission medications   Medication Sig Start Date End Date Taking? Authorizing Provider  acetaminophen (TYLENOL) 500 MG tablet Take 500 mg by mouth every 6 (six) hours as needed for moderate pain or headache.    [provider]  cyclobenzaprine (FLEXERIL) 10 MG tablet Take 1 tablet (10 mg total) by mouth 3 (three) times daily as needed (headache). Patient not taking: Reported on 08/16/2021 03/31/21   Judeth Horn, NP  ibuprofen (ADVIL) 600 MG tablet Take 1 tablet (600 mg total) by mouth every 6 (six) hours as needed for mild pain, moderate pain or cramping. Patient not taking: Reported on 08/16/2021 05/20/21   Candice Camp, MD  metoCLOPramide (REGLAN) 10 MG tablet Take 1 tablet (10 mg total) by mouth every 8 (eight) hours as needed for nausea (or headache). Patient not taking: Reported on 08/16/2021 03/31/21   Judeth Horn, NP     Family History  Problem Relation Age of Onset   Hypertension Mother     Social History   Socioeconomic History   Marital status: Married    Spouse name: Not on file   Number of children: Not on file   Years of education: Not on file   Highest education level: Not on file  Occupational History   Not on  file  Tobacco Use   Smoking status: Never   Smokeless tobacco: Never  Vaping Use   Vaping Use: Never used  Substance and Sexual Activity   Alcohol use: No   Drug use: No   Sexual activity: Yes    Birth control/protection: None  Other Topics Concern   Not on file  Social History Narrative   Not on file   Social Determinants of Health   Financial Resource Strain: Not on file  Food Insecurity: Not on file  Transportation Needs: Not on file  Physical Activity: Not on file  Stress: Not on file  Social Connections: Not on file    Review of Systems: A 12 point ROS discussed and pertinent positives are indicated in the HPI above.  All other systems are negative.  Review of Systems  Constitutional:  Negative for activity change, fatigue and fever.  Respiratory:  Negative for cough and shortness of breath.   Cardiovascular:  Negative for chest pain.  Gastrointestinal:  Negative for abdominal pain.  Musculoskeletal:  Negative for back pain.  Neurological:  Negative for weakness.  Psychiatric/Behavioral:  Negative for behavioral problems and confusion.     Vital Signs: There were no vitals taken for this visit.    Physical Exam Vitals reviewed.  HENT:     Mouth/Throat:     Mouth: Mucous membranes are moist.  Cardiovascular:     Rate and Rhythm: Normal rate and regular rhythm.     Heart  sounds: Normal heart sounds.  Pulmonary:     Effort: Pulmonary effort is normal.     Breath sounds: Normal breath sounds.  Abdominal:     Palpations: Abdomen is soft.  Musculoskeletal:        General: Normal range of motion.     Right lower leg: No edema.     Left lower leg: No edema.  Skin:    General: Skin is warm.  Neurological:     Mental Status: She is alert and oriented to person, place, and time.  Psychiatric:        Behavior: Behavior normal.     Comments: Does not speak English well Has interpreter in room     Imaging: No results found.  Labs:  CBC: Recent Labs     05/18/21 0039 05/19/21 0419  WBC 7.3 8.7  HGB 11.2* 10.3*  HCT 33.2* 29.9*  PLT 281 224    COAGS: No results for input(s): "INR", "APTT" in the last 8760 hours.  BMP: No results for input(s): "NA", "K", "CL", "CO2", "GLUCOSE", "BUN", "CALCIUM", "CREATININE", "GFRNONAA", "GFRAA" in the last 8760 hours.  Invalid input(s): "CMP"  LIVER FUNCTION TESTS: No results for input(s): "BILITOT", "AST", "ALT", "ALKPHOS", "PROT", "ALBUMIN" in the last 8760 hours.  TUMOR MARKERS: No results for input(s): "AFPTM", "CEA", "CA199", "CHROMGRNA" in the last 8760 hours.  Assessment and Plan:  Random renal biopsy scheduled today Persistent mild proteinuria noted by OB MD since 2015 Worsening proteinuria since recent pregnancy - delivery May 2023 Risks and benefits of random renal biopsy was discussed with the patient and/or patient's family through interpreter Erica Becker at bedside including, but not limited to bleeding, infection, damage to adjacent structures or low yield requiring additional tests.  All of the questions were answered and there is agreement to proceed. Consent signed and in chart.   Thank you for this interesting consult.  I greatly enjoyed meeting Erica Becker and look forward to participating in their care.  A copy of this report was sent to the requesting provider on this date.  Electronically Signed: Robet Leu, PA-C 08/30/2021, 7:17 AM   I spent a total of  30 Minutes   in face to face in clinical consultation, greater than 50% of which was counseling/coordinating care for random renal biopsy

## 2021-08-30 NOTE — Sedation Documentation (Signed)
Attempted to call report on patient to short stay, nursing not available for report and will call me back.

## 2021-08-30 NOTE — Progress Notes (Signed)
Interpreter "Vevelyn Francois" present for all pre-op questioning.

## 2021-09-03 ENCOUNTER — Encounter (HOSPITAL_COMMUNITY): Payer: Self-pay

## 2021-09-03 LAB — SURGICAL PATHOLOGY

## 2021-11-21 ENCOUNTER — Telehealth: Payer: Self-pay | Admitting: Hematology and Oncology

## 2021-11-21 NOTE — Telephone Encounter (Signed)
Scheduled appointment per 11/08 referral . Patient is aware of appointment date and time. Patient is aware to arrive 15 mins prior to appointment time and to bring updated insurance cards. Patient is aware of location.   

## 2021-11-28 ENCOUNTER — Telehealth: Payer: Self-pay | Admitting: *Deleted

## 2021-11-28 NOTE — Telephone Encounter (Signed)
Called Dr Ronalee Belts at [facility]. Pt was referred to Dr Leonides Schanz. We will be cancelling this appt as this referral is not appropriate for Dr Derek Mound field/scope. Pt needs to be seen by [facility] or [facility]. Please send the referral to them. LM for Dr Dorena Cookey assistant Marchelle Folks. To call us if they have any questions

## 2021-12-12 ENCOUNTER — Other Ambulatory Visit: Payer: BC Managed Care – PPO

## 2021-12-12 ENCOUNTER — Encounter: Payer: BC Managed Care – PPO | Admitting: Hematology and Oncology

## 2022-10-04 ENCOUNTER — Ambulatory Visit (HOSPITAL_COMMUNITY)
Admission: EM | Admit: 2022-10-04 | Discharge: 2022-10-04 | Disposition: A | Discharge status: Home / Self Care | Payer: Self-pay | Attending: Family Medicine | Admitting: Family Medicine

## 2022-10-04 ENCOUNTER — Encounter (HOSPITAL_COMMUNITY): Payer: Self-pay | Admitting: Emergency Medicine

## 2022-10-04 DIAGNOSIS — R519 Headache, unspecified: Secondary | ICD-10-CM

## 2022-10-04 MED ORDER — KETOROLAC TROMETHAMINE 30 MG/ML IJ SOLN
30.0000 mg | Freq: Once | INTRAMUSCULAR | Status: AC
Start: 2022-10-04 — End: 2022-10-04
  Administered 2022-10-04: 30 mg via INTRAMUSCULAR

## 2022-10-04 MED ORDER — METHYLPREDNISOLONE SODIUM SUCC 125 MG IJ SOLR
40.0000 mg | Freq: Once | INTRAMUSCULAR | Status: AC
Start: 2022-10-04 — End: 2022-10-04
  Administered 2022-10-04: 40 mg via INTRAMUSCULAR

## 2022-10-04 MED ORDER — KETOROLAC TROMETHAMINE 30 MG/ML IJ SOLN
INTRAMUSCULAR | Status: AC
  Filled 2022-10-04: qty 1

## 2022-10-04 MED ORDER — METHYLPREDNISOLONE SODIUM SUCC 125 MG IJ SOLR
INTRAMUSCULAR | Status: AC
  Filled 2022-10-04: qty 2

## 2022-10-04 NOTE — ED Provider Notes (Signed)
[facility]    CSN: [id number] Arrival date & time: 10/04/22  1041      History   Chief Complaint Chief Complaint  Patient presents with   Headache    HPI Erica Becker is a 32 y.o. female.    Headache  Patient is here for a headache x 3 days.  She gets headaches often.  She denies h/o migraines.  Pain is at the top/back of the head. Unable to sleep last night due to pain.  No n/v.  No fevers/chills.  No dizziness or light headedness.  She did take tylenol with only a bit of help.  LMP was last week.  Normal.      Past Medical History:  Diagnosis Date   Migraines    Pregnancy induced hypertension     Patient Active Problem List   Diagnosis Date Noted   Encounter for induction of labor 05/18/2021   Language difficulty 03/31/2021   Irregular periods 03/31/2021   Migraine 07/25/2020   Hyperlipidemia 07/25/2020    Past Surgical History:  Procedure Laterality Date   NO PAST SURGERIES      OB History     Gravida  3   Para  3   Term  3   Preterm      AB      Living  3      SAB      IAB      Ectopic      Multiple  0   Live Births  3            Home Medications    Prior to Admission medications   Medication Sig Start Date End Date Taking? Authorizing Provider  acetaminophen (TYLENOL) 500 MG tablet Take 500 mg by mouth every 6 (six) hours as needed for moderate pain or headache.    [provider]  cyclobenzaprine (FLEXERIL) 10 MG tablet Take 1 tablet (10 mg total) by mouth 3 (three) times daily as needed (headache). Patient not taking: Reported on 08/16/2021 03/31/21   Judeth Horn, NP  ibuprofen (ADVIL) 600 MG tablet Take 1 tablet (600 mg total) by mouth every 6 (six) hours as needed for mild pain, moderate pain or cramping. Patient not taking: Reported on 08/16/2021 05/20/21   Candice Camp, MD  metoCLOPramide (REGLAN) 10 MG tablet Take 1 tablet (10 mg total) by mouth every 8 (eight) hours as needed for nausea (or  headache). Patient not taking: Reported on 08/16/2021 03/31/21   Judeth Horn, NP    Family History Family History  Problem Relation Age of Onset   Hypertension Mother     Social History Social History   Tobacco Use   Smoking status: Never   Smokeless tobacco: Never  Vaping Use   Vaping status: Never Used  Substance Use Topics   Alcohol use: No   Drug use: No     Allergies   Patient has no known allergies.   Review of Systems Review of Systems  Constitutional: Negative.   HENT: Negative.    Respiratory: Negative.    Cardiovascular: Negative.   Gastrointestinal: Negative.   Musculoskeletal: Negative.   Neurological:  Positive for headaches.  Psychiatric/Behavioral: Negative.       Physical Exam Triage Vital Signs ED Triage Vitals [10/04/22 1212]  Encounter Vitals Group     BP 118/85     Systolic BP Percentile      Diastolic BP Percentile      Pulse Rate 61  Resp 16     Temp 98.1 F (36.7 C)     Temp Source Oral     SpO2 98 %     Weight      Height      Head Circumference      Peak Flow      Pain Score 8     Pain Loc      Pain Education      Exclude from Growth Chart    No data found.  Updated Vital Signs BP 118/85 (BP Location: Left Arm)   Pulse 61   Temp 98.1 F (36.7 C) (Oral)   Resp 16   SpO2 98%   Visual Acuity Right Eye Distance:   Left Eye Distance:   Bilateral Distance:    Right Eye Near:   Left Eye Near:    Bilateral Near:     Physical Exam Constitutional:      Appearance: She is well-developed.  HENT:     Head: Normocephalic and atraumatic.     Mouth/Throat:     Mouth: Mucous membranes are moist.  Eyes:     Extraocular Movements: Extraocular movements intact.     Pupils: Pupils are equal, round, and reactive to light. Pupils are equal.  Cardiovascular:     Rate and Rhythm: Normal rate and regular rhythm.  Pulmonary:     Effort: Pulmonary effort is normal.     Breath sounds: Normal breath sounds.  Abdominal:      General: Bowel sounds are normal.  Musculoskeletal:     Cervical back: Normal range of motion and neck supple.  Skin:    General: Skin is warm and dry.  Neurological:     Mental Status: She is alert.  Psychiatric:        Mood and Affect: Mood normal.        Speech: Speech normal.      UC Treatments / Results  Labs (all labs ordered are listed, but only abnormal results are displayed) Labs Reviewed - No data to display  EKG   Radiology No results found.  Procedures Procedures (including critical care time)  Medications Ordered in UC Medications  ketorolac (TORADOL) 30 MG/ML injection 30 mg (has no administration in time range)  methylPREDNISolone sodium succinate (SOLU-MEDROL) 125 mg/2 mL injection 40 mg (has no administration in time range)    Initial Impression / Assessment and Plan / UC Course  I have reviewed the triage vital signs and the nursing notes.  Pertinent labs & imaging results that were available during my care of the patient were reviewed by me and considered in my medical decision making (see chart for details).   Final Clinical Impressions(s) / UC Diagnoses   Final diagnoses:  Bad headache     Discharge Instructions      Vous avez t vu aujourd'hui pour des maux de tte.  Je vous ai donn plusieurs injections aujourd'hui pour Financial planner.  Je vous recommande de vous reposer suffisamment.  Je vous recommande d'essayer la migraine excedrine en vente libre pour vos maux de tte si ncessaire.  Si vous dveloppez une aggravation de Armed forces logistics/support/administrative officer, des nauses ou des vomissements, Microbiologist vous rendre aux urgences pour [employer] plus approfondie.  You were seen today for headache.  I have given you several shots today to help with pain.  I recommend you get plenty of rest.  I recommend you trial over the counter excedrin migraine for you headache if needed.  If you  develop worsening pain, nausea or vomiting then please go to the ER for  further evaluation.      ED Prescriptions   None    PDMP not reviewed this encounter.   Jannifer Franklin, MD 10/04/22 [phone]

## 2022-10-04 NOTE — ED Triage Notes (Signed)
Pt states she has high bp and cholesterol. BP is currently 118/85  She came in for Headache that started 3 days ago.  She took tylenol yesterday.

## 2022-10-04 NOTE — Discharge Instructions (Addendum)
Vous avez t vu aujourd'hui pour [employer] de tte.  Je vous ai donn plusieurs injections aujourd'hui pour Financial planner.  Je vous recommande de vous reposer suffisamment.  Je vous recommande d'essayer la migraine excedrine en vente libre pour vos maux de tte si ncessaire.  Si vous dveloppez une aggravation de Armed forces logistics/support/administrative officer, des nauses ou des vomissements, Microbiologist vous rendre aux urgences pour [employer] plus approfondie.  You were seen today for headache.  I have given you several shots today to help with pain.  I recommend you get plenty of rest.  I recommend you trial over the counter excedrin migraine for you headache if needed.  If you develop worsening pain, nausea or vomiting then please go to the ER for further evaluation.

## 2023-07-07 ENCOUNTER — Ambulatory Visit (HOSPITAL_COMMUNITY)
Admission: EM | Admit: 2023-07-07 | Discharge: 2023-07-07 | Disposition: A | Discharge status: Home / Self Care | Attending: Nurse Practitioner | Admitting: Nurse Practitioner

## 2023-07-07 ENCOUNTER — Telehealth (HOSPITAL_COMMUNITY): Payer: Self-pay

## 2023-07-07 DIAGNOSIS — L02811 Cutaneous abscess of head [any part, except face]: Secondary | ICD-10-CM

## 2023-07-07 DIAGNOSIS — R519 Headache, unspecified: Secondary | ICD-10-CM | POA: Diagnosis not present

## 2023-07-07 MED ORDER — KETOROLAC TROMETHAMINE 30 MG/ML IJ SOLN
INTRAMUSCULAR | Status: AC
  Filled 2023-07-07: qty 1

## 2023-07-07 MED ORDER — DOXYCYCLINE HYCLATE 100 MG PO CAPS: 100.0000 mg | ORAL_CAPSULE | Freq: Two times a day (BID) | ORAL | 0 refills | Status: AC

## 2023-07-07 MED ORDER — DOXYCYCLINE HYCLATE 100 MG PO CAPS: 100.0000 mg | ORAL_CAPSULE | Freq: Two times a day (BID) | ORAL | 0 refills | Status: DC

## 2023-07-07 MED ORDER — KETOROLAC TROMETHAMINE 30 MG/ML IJ SOLN
30.0000 mg | Freq: Once | INTRAMUSCULAR | Status: AC
  Administered 2023-07-07: 30 mg via INTRAMUSCULAR

## 2023-07-07 NOTE — Discharge Instructions (Signed)
 We gave you an injection of NSAID medicine today to help with pain and inflammation.  You can continue Tylenol  500 to 1000 mg every 6 hours at home as needed for the headache.  Additionally, take the antibiotic doxycycline as prescribed to treat for infection in the skin of your scalp.  Seek care if symptoms worsen despite treatment.

## 2023-07-07 NOTE — ED Provider Notes (Signed)
 [facility]    CSN: [id number] Arrival date & time: 07/07/23  1324      History   Chief Complaint No chief complaint on file.   HPI Erica Becker is a 33 y.o. female.   Patient presents today for 2-day history of scalp pain.  She reports there are 2 distinct areas on her scalp that are raised and painful and is causing her to have a headache.  She has taken Tylenol  for the pain without improvement.  She endorses a little bit of drainage from the areas.  No fevers or nausea/vomiting.  No vision changes, blurred vision, double vision, dizziness or lightheadedness.  No recent injury to the scalp.    Past Medical History:  Diagnosis Date   Migraines    Pregnancy induced hypertension     Patient Active Problem List   Diagnosis Date Noted   Encounter for induction of labor 05/18/2021   Language difficulty 03/31/2021   Irregular periods 03/31/2021   Migraine 07/25/2020   Hyperlipidemia 07/25/2020    Past Surgical History:  Procedure Laterality Date   NO PAST SURGERIES      OB History     Gravida  3   Para  3   Term  3   Preterm      AB      Living  3      SAB      IAB      Ectopic      Multiple  0   Live Births  3            Home Medications    Prior to Admission medications   Medication Sig Start Date End Date Taking? Authorizing Provider  doxycycline (VIBRAMYCIN) 100 MG capsule Take 1 capsule (100 mg total) by mouth 2 (two) times daily for 7 days. 07/07/23 07/14/23 Yes Chandra Harlene LABOR, NP    Family History Family History  Problem Relation Age of Onset   Hypertension Mother     Social History Social History   Tobacco Use   Smoking status: Never   Smokeless tobacco: Never  Vaping Use   Vaping status: Never Used  Substance Use Topics   Alcohol use: No   Drug use: No     Allergies   Patient has no known allergies.   Review of Systems Review of Systems Per HPI  Physical Exam Triage Vital Signs ED Triage  Vitals  Encounter Vitals Group     BP 07/07/23 1451 (!) 140/100     Girls Systolic BP Percentile --      Girls Diastolic BP Percentile --      Boys Systolic BP Percentile --      Boys Diastolic BP Percentile --      Pulse Rate 07/07/23 1451 92     Resp 07/07/23 1451 18     Temp 07/07/23 1451 97.6 F (36.4 C)     Temp Source 07/07/23 1451 Oral     SpO2 07/07/23 1451 99 %     Weight --      Height --      Head Circumference --      Peak Flow --      Pain Score 07/07/23 1452 6     Pain Loc --      Pain Education --      Exclude from Growth Chart --    No data found.  Updated Vital Signs BP (!) 140/100 (BP Location: Left Arm)   Pulse  92   Temp 97.6 F (36.4 C) (Oral)   Resp 18   LMP 06/11/2023 (Approximate)   SpO2 99%   Visual Acuity Right Eye Distance:   Left Eye Distance:   Bilateral Distance:    Right Eye Near:   Left Eye Near:    Bilateral Near:     Physical Exam Vitals and nursing note reviewed.  Constitutional:      General: She is not in acute distress.    Appearance: Normal appearance. She is not toxic-appearing.  HENT:     Mouth/Throat:     Mouth: Mucous membranes are moist.     Pharynx: Oropharynx is clear.   Eyes:     General: No scleral icterus.       Right eye: No discharge.        Left eye: No discharge.     Extraocular Movements: Extraocular movements intact.     Pupils: Pupils are equal, round, and reactive to light.   Pulmonary:     Effort: Pulmonary effort is normal. No respiratory distress.   Skin:    General: Skin is warm and dry.     Capillary Refill: Capillary refill takes less than 2 seconds.     Findings: Abscess present.     Comments: 2 distinct abscesses noted to scalp.  One of the abscesses is draining purulent drainage.  They are tender to touch.  They are indurated and no fluctuance.   Neurological:     Mental Status: She is alert and oriented to person, place, and time.   Psychiatric:        Behavior: Behavior is  cooperative.      UC Treatments / Results  Labs (all labs ordered are listed, but only abnormal results are displayed) Labs Reviewed - No data to display  EKG   Radiology No results found.  Procedures Procedures (including critical care time)  Medications Ordered in UC Medications  ketorolac  (TORADOL ) 30 MG/ML injection 30 mg (30 mg Intramuscular Given 07/07/23 1521)    Initial Impression / Assessment and Plan / UC Course  I have reviewed the triage vital signs and the nursing notes.  Pertinent labs & imaging results that were available during my care of the patient were reviewed by me and considered in my medical decision making (see chart for details).   Patient is mildly hypertensive in urgent care, otherwise vital signs are stable.  1. Abscess of scalp 2. Acute nonintractable headache, unspecified headache type Treat with doxycycline twice daily for 7 days No indication for I&D today Wound care discussed Pain treated with Toradol  30 mg IM in urgent care today Return and ER precautions discussed   The patient was given the opportunity to ask questions.  All questions answered to their satisfaction.  The patient is in agreement to this plan.   Final Clinical Impressions(s) / UC Diagnoses   Final diagnoses:  Abscess of scalp  Acute nonintractable headache, unspecified headache type     Discharge Instructions      We gave you an injection of NSAID medicine today to help with pain and inflammation.  You can continue Tylenol  500 to 1000 mg every 6 hours at home as needed for the headache.  Additionally, take the antibiotic doxycycline as prescribed to treat for infection in the skin of your scalp.  Seek care if symptoms worsen despite treatment.    ED Prescriptions     Medication Sig Dispense Auth. Provider   doxycycline (VIBRAMYCIN) 100 MG capsule Take  1 capsule (100 mg total) by mouth 2 (two) times daily for 7 days. 14 capsule Chandra Harlene LABOR, NP       PDMP not reviewed this encounter.   Chandra Harlene LABOR, NP 07/07/23 [phone]

## 2023-07-07 NOTE — ED Triage Notes (Signed)
 Patient reports knot on her scalp x 2 days. Patient reports this is causing her an headache.

## 2023-07-07 NOTE — Telephone Encounter (Signed)
Medication sent to [facility] 

## 2023-08-06 ENCOUNTER — Other Ambulatory Visit: Payer: Self-pay

## 2023-08-06 ENCOUNTER — Ambulatory Visit (HOSPITAL_COMMUNITY): Admission: EM | Admit: 2023-08-06 | Discharge: 2023-08-06 | Disposition: A | Discharge status: Home / Self Care

## 2023-08-06 ENCOUNTER — Encounter (HOSPITAL_COMMUNITY): Payer: Self-pay | Admitting: *Deleted

## 2023-08-06 DIAGNOSIS — R519 Headache, unspecified: Secondary | ICD-10-CM | POA: Diagnosis not present

## 2023-08-06 MED ORDER — KETOROLAC TROMETHAMINE 30 MG/ML IJ SOLN
30.0000 mg | Freq: Once | INTRAMUSCULAR | Status: AC
  Administered 2023-08-06: 30 mg via INTRAMUSCULAR

## 2023-08-06 MED ORDER — DICLOFENAC SODIUM 50 MG PO TBEC: 50.0000 mg | DELAYED_RELEASE_TABLET | Freq: Two times a day (BID) | ORAL | 1 refills | Status: AC

## 2023-08-06 MED ORDER — KETOROLAC TROMETHAMINE 30 MG/ML IJ SOLN
INTRAMUSCULAR | Status: AC
  Filled 2023-08-06: qty 1

## 2023-08-06 NOTE — Discharge Instructions (Signed)
  1. Intractable episodic headache, unspecified headache type (Primary) - ketorolac  (TORADOL ) 30 MG/ML IM injection 30 mg given in UC for acute on chronic headache. - Ambulatory referral to Neurology for follow-up evaluation of ongoing intermittent headaches. - diclofenac  (VOLTAREN ) 50 MG EC tablet; Take 1 tablet (50 mg total) by mouth 2 (two) times daily.  Dispense: 30 tablet; Refill: 1 -Continue to monitor symptoms for any change in severity if there is any escalation of current symptoms or development of new symptoms follow-up in ER for further evaluation and management.

## 2023-08-06 NOTE — ED Triage Notes (Signed)
 Pt reports a HA for 2 Days.

## 2023-08-06 NOTE — ED Provider Notes (Signed)
 [facility]  Note:  This document was prepared using Dragon voice recognition software and may include unintentional dictation errors.  MRN: [id number] DOB: 09-01-1990  Subjective:   Erica Becker is a 33 y.o. female presenting for persistent headache since yesterday.  Patient reports past history of episodic headaches.  Patient reports that she was most recently treated approximately a month ago for headache secondary to an abscess on her head and since then she has had intermittent headaches.  Prior to that she had a headache in 2024 but has never seen neurology for recurrent headaches.  Patient has not taken any over-the-counter medication to treat symptoms.  Patient reports photophobia and mild blurred vision.  Patient believes that headaches are secondary to her elevated blood pressure however her blood pressure is not significantly elevated at this time.  No current facility-administered medications for this encounter.  Current Outpatient Medications:    diclofenac  (VOLTAREN ) 50 MG EC tablet, Take 1 tablet (50 mg total) by mouth 2 (two) times daily., Disp: 30 tablet, Rfl: 1   No Known Allergies  Past Medical History:  Diagnosis Date   Migraines    Pregnancy induced hypertension      Past Surgical History:  Procedure Laterality Date   NO PAST SURGERIES      Family History  Problem Relation Age of Onset   Hypertension Mother     Social History   Tobacco Use   Smoking status: Never   Smokeless tobacco: Never  Vaping Use   Vaping status: Never Used  Substance Use Topics   Alcohol use: No   Drug use: No    ROS Refer to HPI for ROS details.  Objective:   Vitals: BP (!) 141/90   Pulse 77   Temp 98.5 F (36.9 C)   Resp 18   LMP 07/12/2023 (Approximate)   SpO2 98%   Physical Exam Vitals and nursing note reviewed.  Constitutional:      General: She is not in acute distress.    Appearance: Normal appearance. She is well-developed. She is not  ill-appearing or toxic-appearing.  HENT:     Head: Normocephalic and atraumatic.  Eyes:     General:        Right eye: No discharge.        Left eye: No discharge.     Extraocular Movements: Extraocular movements intact.     Conjunctiva/sclera: Conjunctivae normal.  Cardiovascular:     Rate and Rhythm: Normal rate.  Pulmonary:     Effort: Pulmonary effort is normal. No respiratory distress.  Skin:    General: Skin is warm and dry.  Neurological:     General: No focal deficit present.     Mental Status: She is alert and oriented to person, place, and time.     Cranial Nerves: No cranial nerve deficit.     Sensory: No sensory deficit.     Motor: No weakness.  Psychiatric:        Mood and Affect: Mood normal.        Behavior: Behavior normal.        Thought Content: Thought content normal.        Judgment: Judgment normal.     Procedures  No results found for this or any previous visit (from the past 24 hours).  No results found.   Assessment and Plan :     Discharge Instructions       1. Intractable episodic headache, unspecified headache type (Primary) - ketorolac  (TORADOL )  30 MG/ML IM injection 30 mg given in UC for acute on chronic headache. - Ambulatory referral to Neurology for follow-up evaluation of ongoing intermittent headaches. - diclofenac  (VOLTAREN ) 50 MG EC tablet; Take 1 tablet (50 mg total) by mouth 2 (two) times daily.  Dispense: 30 tablet; Refill: 1 -Continue to monitor symptoms for any change in severity if there is any escalation of current symptoms or development of new symptoms follow-up in ER for further evaluation and management.      Surah Pelley B Andrewjames Weirauch   Laiyla Slagel, [place], [place] 08/06/23 1528

## 2023-08-11 ENCOUNTER — Encounter: Payer: Self-pay | Admitting: Neurology

## 2023-10-15 NOTE — Progress Notes (Deleted)
 Initial neurology clinic note  Erica Becker MRN: [id number] DOB: 1990/08/19  Referring provider: Aurea Ethel NOVAK, NP  Primary care provider: Joeann Meiers, MD (Inactive)  Reason for consult:  headaches  Subjective:  This is Ms. Erica Becker, a 33 y.o. ***-handed female with a medical history of *** who presents to neurology clinic with ***. The patient is accompanied by ***.  *** Been to urgent care multiple times for headache (10/04/22, 07/07/23, and 08/06/23) Has photophobia and blurry vision with headaches On 07/07/23 there was mention of raised and painful areas of her scalp causing headache UC notes mention abscess and drainage from scalp She was given doxycycline  for 7 days  Migraines are listed on problem list  Any supplements?   Smoker: OCP use: Caffiene use: EtOH use: Restrictive diet: Family history of neurologic disease including headaches:   MEDICATIONS:  Outpatient Encounter Medications as of 10/23/2023  Medication Sig   diclofenac  (VOLTAREN ) 50 MG EC tablet Take 1 tablet (50 mg total) by mouth 2 (two) times daily.   No facility-administered encounter medications on file as of 10/23/2023.    PAST MEDICAL HISTORY: Past Medical History:  Diagnosis Date   Migraines    Pregnancy induced hypertension     PAST SURGICAL HISTORY: Past Surgical History:  Procedure Laterality Date   NO PAST SURGERIES      ALLERGIES: No Known Allergies  FAMILY HISTORY: Family History  Problem Relation Age of Onset   Hypertension Mother     SOCIAL HISTORY: Social History   Tobacco Use   Smoking status: Never   Smokeless tobacco: Never  Vaping Use   Vaping status: Never Used  Substance Use Topics   Alcohol use: No   Drug use: No   Social History   Social History Narrative   Not on file    Objective:  Vital Signs:  There were no vitals taken for this visit.  ***  Labs and Imaging review: Internal labs: ***  External  labs: 05/07/22: B12: 245 dsDNA neg ANA neg CRP < 4.0  Imaging/Procedures: Cervical and thoracic spine xray (07/06/15): FINDINGS: There is no acute fracture or subluxation of the cervical spine. The vertebral body heights and disc spaces are maintained. There is straightening of normal cervical lordosis which may be positional or due to muscle spasm. The spinous processes and odontoid appear intact. There is anatomic alignment of the lateral masses of the C1-C2. The soft tissues appear unremarkable.   No acute fracture or subluxation of the thoracic spine. The vertebral body heights and disc spaces are preserved. The pedicles are intact. The soft tissues appear unremarkable.   IMPRESSION: No acute/ traumatic cervical or thoracic spine pathology ***  Assessment/Plan:  Erica Becker is a 33 y.o. female who presents for evaluation of ***. *** has a relevant medical history of ***. *** neurological examination is pertinent for ***. Available diagnostic data is significant for ***. This constellation of symptoms and objective data would most likely localize to ***. ***  PLAN: -Blood work: *** ***  -Return to clinic ***  The impression above as well as the plan as outlined below were extensively discussed with the patient (in the company of ***) who voiced understanding. All questions were answered to their satisfaction.  The patient was counseled on pertinent fall precautions per the printed material provided today, and as noted under the Patient Instructions section below.***  When available, results of the above investigations and possible further recommendations will be communicated to the patient via telephone/MyChart.  Patient to call office if not contacted after expected testing turnaround time.   Total time spent reviewing records, interview, history/exam, documentation, and coordination of care on day of encounter:  *** min   Thank you for allowing me to participate in  patient's care.  If I can answer any additional questions, I would be pleased to do so.  Venetia Potters, MD   CC: Joeann Meiers, MD (Inactive) No address on file  CC: Referring provider: Aurea Ethel NOVAK, NP [address] [place],  [place] [address]

## 2023-10-23 ENCOUNTER — Ambulatory Visit: Admitting: Neurology

## 2023-11-12 ENCOUNTER — Other Ambulatory Visit: Payer: Self-pay | Admitting: Obstetrics and Gynecology

## 2023-11-12 DIAGNOSIS — Z1231 Encounter for screening mammogram for malignant neoplasm of breast: Secondary | ICD-10-CM

## 2023-12-02 ENCOUNTER — Inpatient Hospital Stay: Admission: RE | Admit: 2023-12-02 | Payer: Self-pay | Source: Ambulatory Visit
